# Patient Record
Sex: Female | Born: 1965
Health system: Southern US, Community
[De-identification: ages and names within clinical notes are randomized; demographics above are authoritative.]

## PROBLEM LIST (undated history)

## (undated) DIAGNOSIS — B20 Human immunodeficiency virus [HIV] disease: Secondary | ICD-10-CM

## (undated) DIAGNOSIS — Z21 Asymptomatic human immunodeficiency virus [HIV] infection status: Secondary | ICD-10-CM

## (undated) DIAGNOSIS — I10 Essential (primary) hypertension: Secondary | ICD-10-CM

## (undated) HISTORY — PX: ABDOMINAL HYSTERECTOMY: SHX81

## (undated) HISTORY — DX: Human immunodeficiency virus (HIV) disease: B20

## (undated) HISTORY — DX: Asymptomatic human immunodeficiency virus (hiv) infection status: Z21

## (undated) HISTORY — PX: BREAST BIOPSY: SHX20

---

## 2019-04-30 ENCOUNTER — Telehealth: Payer: Self-pay | Admitting: Hematology and Oncology

## 2019-04-30 NOTE — Telephone Encounter (Signed)
Scheduled per referral. Called and spoke with pt, confirmed 4/1 appt. Pt made aware to arrive 15-30 mins early and to bring insurance card with photo ID 

## 2019-05-05 ENCOUNTER — Encounter: Payer: Self-pay | Admitting: Infectious Diseases

## 2019-05-05 ENCOUNTER — Ambulatory Visit (INDEPENDENT_AMBULATORY_CARE_PROVIDER_SITE_OTHER): Payer: 59 | Admitting: Infectious Diseases

## 2019-05-05 ENCOUNTER — Other Ambulatory Visit: Payer: Self-pay

## 2019-05-05 ENCOUNTER — Telehealth: Payer: Self-pay | Admitting: Pharmacy Technician

## 2019-05-05 VITALS — BP 154/93 | HR 94 | Temp 98.4°F | Ht 69.0 in | Wt 135.0 lb

## 2019-05-05 DIAGNOSIS — R634 Abnormal weight loss: Secondary | ICD-10-CM | POA: Diagnosis not present

## 2019-05-05 DIAGNOSIS — R590 Localized enlarged lymph nodes: Secondary | ICD-10-CM | POA: Insufficient documentation

## 2019-05-05 DIAGNOSIS — B20 Human immunodeficiency virus [HIV] disease: Secondary | ICD-10-CM

## 2019-05-05 MED ORDER — BICTEGRAVIR-EMTRICITAB-TENOFOV 50-200-25 MG PO TABS: 1.0000 | ORAL_TABLET | Freq: Every day | ORAL | 5 refills | Status: DC

## 2019-05-05 NOTE — Assessment & Plan Note (Addendum)
Patient reports she was first diagnosed with HIV in 2010 and was started on Stribild and managed by her local health department. She was on the medication for about 3-4 years. The medication made her sick so she stopped taking it. She has been off ART for 3-4 years now.   Today she is agreeable to start [place] one tablet once a day. She was introduced to the pharmacist and pharmacy tech to help with medication management. She received a month sample of Biktarvy in the meantime. Will check labs today including: HIV viral load, CD4 count, CMP with GFR, CBC with diff/platelets, Hep B surface antigen, antibody, qualitative and core, Hep A antibody, Hep C antibody, Quantiferon-TB, and HLAB*5701.  Educated patient about the clinic free mental health counseling that is available to her.   Will check viral load and CD4 count again in 4 weeks and will follow up with patient in 6 weeks.

## 2019-05-05 NOTE — Progress Notes (Signed)
HPI: Courtney Kim is a 54 y.o. female who presents to the [place] clinic today to initiate care with NP Dixon for her HIV infection.  There are no problems to display for this patient.   Patient's Medications  New Prescriptions   BICTEGRAVIR-EMTRICITABINE-TENOFOVIR AF (BIKTARVY) 50-200-25 MG TABS TABLET    Take 1 tablet by mouth daily. Try to take at the same time each day with or without food.  Previous Medications   No medications on file  Modified Medications   No medications on file  Discontinued Medications   No medications on file    Allergies: Allergies  Allergen Reactions  . Hydrocodone-Acetaminophen Other (See Comments)    Past Medical History: Past Medical History:  Diagnosis Date  . HIV infection [facility])     Social History: Social History   Socioeconomic History  . Marital status: Single    Spouse name: Not on file  . Number of children: Not on file  . Years of education: Not on file  . Highest education level: Not on file  Occupational History  . Not on file  Tobacco Use  . Smoking status: Current Some Day Smoker    Types: Cigarettes  . Smokeless tobacco: Current User  Substance and Sexual Activity  . Alcohol use: Never  . Drug use: Never  . Sexual activity: Not Currently    Partners: Male  Other Topics Concern  . Not on file  Social History Narrative   Leave alone   Smoke cigarette occasion   Social Determinants of Health   Financial Resource Strain:   . Difficulty of Paying Living Expenses:   Food Insecurity:   . Worried About Charity fundraiser in the Last Year:   . Arboriculturist in the Last Year:   Transportation Needs:   . Film/video editor (Medical):   Marland Kitchen Lack of Transportation (Non-Medical):   Physical Activity:   . Days of Exercise per Week:   . Minutes of Exercise per Session:   Stress:   . Feeling of Stress :   Social Connections:   . Frequency of Communication with Friends and Family:   . Frequency of Social  Gatherings with Friends and Family:   . Attends Religious Services:   . Active Member of Clubs or Organizations:   . Attends Archivist Meetings:   Marland Kitchen Marital Status:     Labs: No results found for: HIV1RNAQUANT, HIV1RNAVL, CD4TABS  RPR and STI No results found for: LABRPR, RPRTITER  No flowsheet data found.  Hepatitis B No results found for: HEPBSAB, HEPBSAG, HEPBCAB Hepatitis C No results found for: HEPCAB, HCVRNAPCRQN Hepatitis A No results found for: HAV Lipids: No results found for: CHOL, TRIG, HDL, CHOLHDL, VLDL, LDLCALC  Current HIV Regimen: Old Regimen: Stribild (~4 years ago)  Assessment: DC is here today to initiate care with NP Dixon for her HIV infection. DC is treatment experienced with history of Stribild ~4 years ago and she has been without treatment since then. She stopped taking the medication d/t ADE. Will start patient on [place].  We spoke to DC about administration/ADE of Biktarvy stressing the importance of adherence.  The patient asked if the medication will help her gain weight. This was addressed and the patient had no other concerns.   Rx being sent to the [facility] outpatient pharmacy and patient will pick up today.  Plan: 1) Start Biktravy 2) F/U with NP Talmadge [employer] Student Pharmacist, Class of [place]  for Infectious Disease 05/05/2019, 12:19 PM

## 2019-05-05 NOTE — Telephone Encounter (Addendum)
[facility] Patient Advocate Encounter    Findings of the benefits investigation:   Insurance: [employer]- [employer]  Estimated copay amount: $tbd Prior Authorization: will need to submit, [name] is non-formulary  [facility] Patient Advocate Encounter   Was successful in obtaining a [employer] copay card for [employer]. This copay card will make the patients copay $0 once medication is approved.  The billing information is RxBin: [id number] PCN: ACCESS Member ID: [id number] Group ID: [id number]

## 2019-05-05 NOTE — Patient Instructions (Addendum)
It is wonderful to meet you - I am looking forward to working with you.   Biktarvy is the pill I would like for you to start taking to treat you - this will need to be taken once a day around the same time.  - Common side effects for a short time frame usually include headaches, nausea and diarrhea - OK to take over the counter tylenol for headaches and imodium for diarrhea - Try taking with food if you are nauseated  - If you take any multivitamins or supplements please separate them from your Biktarvy by 6 hours before and after.  The main thing is do not have them in the stomach at the same time. - We will be securing copay assistance for this medication so it will be on file with your pharmacy.   I think you will tolerate this pill much better. Eventually I am hopeful we can work with your insurance to   Please stop by the lab on your way out.   Please return in 1 month for repeat labs and a return visit 2 weeks later. We will review you labs together at these appointments.    For your Allergies -  1. Over the counter antihistamines once a day (Zyretec, Claritin, Allegra, or if worse at night can do Benadryl)  2. Nasal spray called Flonase 1-2 sprays a day  3. Saline spray will also help clean out your nose from the allergens  **all of these are safe to take with your biktarvy

## 2019-05-05 NOTE — Assessment & Plan Note (Signed)
Patient reported unintentional weight loss about 30lbs in the past 2 years. She has a good appetite and no trouble with eating. She denies NV/D. Will start on ART and monitor.

## 2019-05-05 NOTE — Assessment & Plan Note (Signed)
Patient report right cervical lymphadenopathy that she noticed around the time she was diagnosed with HIV in 2010. The size has not changed. She is being referred to Oncology by her PCP, appointment is set for April 1st 2021.

## 2019-05-05 NOTE — Progress Notes (Signed)
Subjective:    Patient ID: Courtney Kim is a 54 y.o. female     DOB: [birth year]   MRN: [id number]    Chief Complaint  Patient presents with  . New Patient (Initial Visit)    B20     HPI  This is a new patient to the clinic being referred by her PCP. She is from [place]. Patient was diagnosed with HIV in 2010 she was started on Stribild and was being managed by her local health department. She reported that the Stribild made her feel sick and impacted her tooth. She was on it for about 3-4 years and stopped therapy on her own. She has been off ART for about 4 years now. She has lost 30lbs unintentionally within two years. She has a good appetite. She denies N/V/D. She has some days that she feels tired but overall feels well.  She reports a chronic dry cough for about 7-8 months. Noticed more around time she smokes. The cough occurs throughout the day.  Patient is reporting right neck lymphadenopathy that she had for the past 10 years that has not changed. Her PCP has referred her to Oncology for further evaluation.   Today in clinic patient is emotional and tearful while discussing her HIV status.Gave patient the opportunity to express herself and provided emotional support. Educated patient about the clinic resources including free one on one mental health counseling.   Review of Systems  Constitutional: Positive for weight loss. Negative for chills and fever.  HENT: Positive for congestion.   Eyes: Negative for blurred vision and redness.  Respiratory: Positive for cough. Negative for sputum production, shortness of breath and wheezing.   Cardiovascular: Negative for chest pain, palpitations and leg swelling.  Gastrointestinal: Negative for abdominal pain, constipation, diarrhea, nausea and vomiting.  Musculoskeletal: Negative for back pain and neck pain.  Skin: Negative for rash.  Neurological: Negative for dizziness, weakness and headaches.  Psychiatric/Behavioral: Negative  for substance abuse.      No outpatient medications prior to visit.   No facility-administered medications prior to visit.    Past Medical History:  Diagnosis Date  . HIV infection ([place])     Family History  Problem Relation Age of Onset  . Hypertension Mother     Social History   Socioeconomic History  . Marital status: Single    Spouse name: Not on file  . Number of children: Not on file  . Years of education: Not on file  . Highest education level: Not on file  Occupational History  . Not on file  Tobacco Use  . Smoking status: Current Some Day Smoker    Types: Cigarettes  . Smokeless tobacco: Current User  Substance and Sexual Activity  . Alcohol use: Never  . Drug use: Never  . Sexual activity: Not Currently    Partners: Male  Other Topics Concern  . Not on file  Social History Narrative   Leave alone   Smoke cigarette occasion   Social Determinants of Health   Financial Resource Strain:   . Difficulty of Paying Living Expenses:   Food Insecurity:   . Worried About Charity fundraiser in the Last Year:   . Arboriculturist in the Last Year:   Transportation Needs:   . Film/video editor (Medical):   Marland Kitchen Lack of Transportation (Non-Medical):   Physical Activity:   . Days of Exercise per Week:   . Minutes of Exercise per Session:  Stress:   . Feeling of Stress :   Social Connections:   . Frequency of Communication with Friends and Family:   . Frequency of Social Gatherings with Friends and Family:   . Attends Religious Services:   . Active Member of Clubs or Organizations:   . Attends Archivist Meetings:   Marland Kitchen Marital Status:   Intimate Partner Violence:   . Fear of Current or Ex-Partner:   . Emotionally Abused:   Marland Kitchen Physically Abused:   . Sexually Abused:          Objective:    Today's Vitals   05/05/19 1133  BP: (!) 154/93  Pulse: 94  Temp: 98.4 F (36.9 C)  SpO2: 100%  Weight: 135 lb (61.2 kg)  Height: '5\' 9"'$  (1.753  m)   Body mass index is 19.94 kg/m.  Physical Exam Constitutional:      Appearance: Normal appearance.  HENT:     Head: Normocephalic.     Nose: Nose normal.     Mouth/Throat:     Mouth: Mucous membranes are moist.  Eyes:     Extraocular Movements: Extraocular movements intact.     Pupils: Pupils are equal, round, and reactive to light.  Cardiovascular:     Rate and Rhythm: Normal rate and regular rhythm.     Heart sounds: Normal heart sounds.  Pulmonary:     Effort: Pulmonary effort is normal.     Breath sounds: Normal breath sounds.  Abdominal:     General: Abdomen is flat. Bowel sounds are normal.  Musculoskeletal:        General: Normal range of motion.     Cervical back: Normal range of motion.  Lymphadenopathy:     Cervical: Cervical adenopathy present.  Skin:    General: Skin is warm and dry.  Neurological:     Mental Status: She is alert and oriented to person, place, and time.  Psychiatric:        Behavior: Behavior normal.        Thought Content: Thought content normal.     Comments: Tearful      LABS: No results found for: HIV1RNAQUANT  No results found for: CREATININE  No results found for: ALT, AST, GGT, ALKPHOS, BILITOT  No results found for: WBC, HGB, HCT, MCV, PLT  No results found for: RPR      Assessment & Plan:   Problem List Items Addressed This Visit      Immune and Lymphatic   Lymphadenopathy of right cervical region    Patient report right cervical lymphadenopathy that she noticed around the time she was diagnosed with HIV in 2010. The size has not changed. She is being referred to Oncology by her PCP, appointment is set for April 1st 2021.          Other   Symptomatic HIV infection ([place]) - Primary    Patient reports she was first diagnosed with HIV in 2010 and was started on Stribild and managed by her local health department. She was on the medication for about 3-4 years. The medication made her sick so she stopped taking it.  She has been off ART for 3-4 years now.   Today she is agreeable to start [place] one tablet once a day. She was introduced to the pharmacist and pharmacy tech to help with medication management. She received a month sample of Biktarvy in the meantime. Will check labs today including: HIV viral load, CD4 count, CMP with GFR, CBC  with diff/platelets, Hep B surface antigen, antibody, qualitative and core, Hep A antibody, Hep C antibody, Quantiferon-TB, and HLAB*5701.  Educated patient about the clinic free mental health counseling that is available to her.   Will check viral load and CD4 count again in 4 weeks and will follow up with patient in 6 weeks.        Relevant Medications   bictegravir-emtricitabine-tenofovir AF (BIKTARVY) 50-200-25 MG TABS tablet   Other Relevant Orders   HIV RNA, RTPCR W/R GT (RTI, PI,INT)   T-helper cell (CD4)- ([facility] clinic only)   COMPLETE METABOLIC PANEL WITH GFR   CBC with Differential/Platelet   Hepatitis B surface antigen   Hepatitis B surface antibody,qualitative   Hepatitis A antibody, total   Hepatitis C antibody   Hepatitis B Core Antibody, total   QuantiFERON-TB Gold Plus   HLA B*5701   HIV-1 RNA quant-no reflex-bld   T-helper cell (CD4)- ([facility] clinic only)   Unintentional weight loss    Patient reported unintentional weight loss about 30lbs in the past 2 years. She has a good appetite and no trouble with eating. She denies NV/D. Will start on ART and monitor.            [facility]

## 2019-05-06 ENCOUNTER — Telehealth: Payer: Self-pay | Admitting: Pharmacy Technician

## 2019-05-06 ENCOUNTER — Telehealth: Payer: Self-pay | Admitting: Infectious Diseases

## 2019-05-06 LAB — T-HELPER CELL (CD4) - (RCID CLINIC ONLY)
CD4 % Helper T Cell: 9 % — ABNORMAL LOW (ref 33–65)
CD4 T Cell Abs: 53 /uL — ABNORMAL LOW (ref 400–1790)

## 2019-05-06 MED ORDER — SULFAMETHOXAZOLE-TRIMETHOPRIM 400-80 MG PO TABS: 1.0000 | ORAL_TABLET | Freq: Every day | ORAL | 5 refills | Status: DC

## 2019-05-06 MED FILL — BIKTARVY 50-200-25 MG TABS: 50-200-25 | 30 days supply | Qty: 30 | Fill #0

## 2019-05-06 MED FILL — SULFAMETHOXAZOLE-TMP SS TAB: 400-80 | 30 days supply | Qty: 30 | Fill #0

## 2019-05-06 NOTE — Telephone Encounter (Signed)
[facility] Patient Advocate Encounter   Received notification from [employer] that prior authorization for [name]  is required.   PA submitted on 05/06/2019 Key [id number] Status is pending [phone]    [facility] will continue to follow.  Beulah Gandy, CPhT Specialty Pharmacy Patient [facility] for Infectious Disease Phone: [phone] Fax: [phone] 05/06/2019 12:07 PM

## 2019-05-06 NOTE — Telephone Encounter (Signed)
Patient notified of CD4 < 200. Will add 1 SS Bactrim QD. Welcomed and answered all questions.   She will pick up at [facility]

## 2019-05-06 NOTE — Telephone Encounter (Signed)
[facility] Patient Advocate Encounter  Prior Authorization for [name]  has been approved.    PA# [id number] Case: [id number] Effective dates: 05/06/2019 through 05/05/2020  Patients co-pay is $0.   [facility] will continue to follow. She has 28 days worth of medication. Will connect to see when she would like to pick up her refill.   Beulah Gandy, CPhT Specialty Pharmacy Patient [facility] for Infectious Disease Phone: [phone] Fax: [phone] 05/06/2019 2:38 PM

## 2019-05-16 LAB — HIV-1 INTEGRASE GENOTYPE

## 2019-05-16 LAB — COMPLETE METABOLIC PANEL WITH GFR
AG Ratio: 0.7 (calc) — ABNORMAL LOW (ref 1.0–2.5)
ALT: 21 U/L (ref 6–29)
AST: 32 U/L (ref 10–35)
Albumin: 3.9 g/dL (ref 3.6–5.1)
Alkaline phosphatase (APISO): 54 U/L (ref 37–153)
BUN: 12 mg/dL (ref 7–25)
CO2: 29 mmol/L (ref 20–32)
Calcium: 9.2 mg/dL (ref 8.6–10.4)
Chloride: 105 mmol/L (ref 98–110)
Creat: 0.56 mg/dL (ref 0.50–1.05)
GFR, Est African American: 123 mL/min/{1.73_m2} (ref >=60)
GFR, Est Non African American: 106 mL/min/{1.73_m2} (ref >=60)
Globulin: 6 g/dL (calc) — ABNORMAL HIGH (ref 1.9–3.7)
Glucose, Bld: 69 mg/dL (ref 65–99)
Potassium: 3.8 mmol/L (ref 3.5–5.3)
Sodium: 136 mmol/L (ref 135–146)
Total Bilirubin: 0.5 mg/dL (ref 0.2–1.2)
Total Protein: 9.9 g/dL — ABNORMAL HIGH (ref 6.1–8.1)

## 2019-05-16 LAB — CBC WITH DIFFERENTIAL/PLATELET
Absolute Monocytes: 290 cells/uL (ref 200–950)
Basophils Absolute: 9 cells/uL (ref 0–200)
Basophils Relative: 0.4 %
Eosinophils Absolute: 21 cells/uL (ref 15–500)
Eosinophils Relative: 0.9 %
HCT: 29.2 % — ABNORMAL LOW (ref 35.0–45.0)
Hemoglobin: 9.4 g/dL — ABNORMAL LOW (ref 11.7–15.5)
Lymphs Abs: 787 cells/uL — ABNORMAL LOW (ref 850–3900)
MCH: 27.3 pg (ref 27.0–33.0)
MCHC: 32.2 g/dL (ref 32.0–36.0)
MCV: 84.9 fL (ref 80.0–100.0)
MPV: 11.9 fL (ref 7.5–12.5)
Monocytes Relative: 12.6 %
Neutro Abs: 1194 cells/uL — ABNORMAL LOW (ref 1500–7800)
Neutrophils Relative %: 51.9 %
Platelets: 147 10*3/uL (ref 140–400)
RBC: 3.44 10*6/uL — ABNORMAL LOW (ref 3.80–5.10)
RDW: 14.1 % (ref 11.0–15.0)
Total Lymphocyte: 34.2 %
WBC: 2.3 10*3/uL — ABNORMAL LOW (ref 3.8–10.8)

## 2019-05-16 LAB — QUANTIFERON-TB GOLD PLUS
Mitogen-NIL: 2.64 IU/mL
NIL: 0.07 IU/mL
QuantiFERON-TB Gold Plus: NEGATIVE
TB1-NIL: 0 IU/mL
TB2-NIL: 0.04 IU/mL

## 2019-05-16 LAB — HLA B*5701: HLA-B*5701 w/rflx HLA-B High: NEGATIVE

## 2019-05-16 LAB — HIV RNA, RTPCR W/R GT (RTI, PI,INT)
HIV 1 RNA Quant: 339000 copies/mL — ABNORMAL HIGH
HIV-1 RNA Quant, Log: 5.53 Log copies/mL — ABNORMAL HIGH

## 2019-05-16 LAB — HIV-1 GENOTYPE: HIV-1 Genotype: DETECTED — AB

## 2019-05-16 LAB — HEPATITIS A ANTIBODY, TOTAL: Hepatitis A AB,Total: NONREACTIVE

## 2019-05-16 LAB — HEPATITIS B CORE ANTIBODY, TOTAL: Hep B Core Total Ab: NONREACTIVE

## 2019-05-16 LAB — HEPATITIS C ANTIBODY
Hepatitis C Ab: NONREACTIVE
SIGNAL TO CUT-OFF: 0.26 (ref <1.00)

## 2019-05-16 LAB — HEPATITIS B SURFACE ANTIGEN: Hepatitis B Surface Ag: NONREACTIVE

## 2019-05-16 LAB — HEPATITIS B SURFACE ANTIBODY,QUALITATIVE: Hep B S Ab: BORDERLINE — AB

## 2019-05-18 ENCOUNTER — Other Ambulatory Visit: Payer: 59

## 2019-05-18 ENCOUNTER — Other Ambulatory Visit: Payer: Self-pay

## 2019-05-18 DIAGNOSIS — B20 Human immunodeficiency virus [HIV] disease: Secondary | ICD-10-CM

## 2019-05-19 LAB — T-HELPER CELL (CD4) - (RCID CLINIC ONLY)
CD4 % Helper T Cell: 10 % — ABNORMAL LOW (ref 33–65)
CD4 T Cell Abs: 152 /uL — ABNORMAL LOW (ref 400–1790)

## 2019-05-20 NOTE — Progress Notes (Signed)
[place] [phone]) [phone]   [phone]  INITIAL CONSULT NOTE  Patient Care Team: Patient, No Pcp Per as PCP - General (General Practice)  Hematological/Oncological History # Hyperproteinemia 1) 05/05/2019: Protein 9.9 on routine CMP.  2) 05/21/2019: establish care with Dr. Lorenso Courier   #Right Cervical Lymphadenopathy  1) patient notes it has been present since time of HIV diagnosis in 2010 2) 05/21/2019: establish care with Dr. Lorenso Courier   #Normocytic Anemia 1) 10/03/2016: WBC 3.1, Hgb 10.5, Plt 224, MCV 85 2) 05/05/2019: WBC 2.3, Hgb 9.4, MCV 84.9, Plt 147. CD4 count 53 3) 05/21/2019: establish care with Dr. Lorenso Courier   CHIEF COMPLAINTS/PURPOSE OF CONSULTATION:  "Hyperproteinemia "  HISTORY OF PRESENTING ILLNESS:  Courtney Kim 54 y.o. female with medical history significant for HIV infection who presents for evaluation of hyperproteinemia.   On review of the previous records the patient recently established with Dr. Janene Madeira with the [facility].  She was initially seen on 05/05/2019 at which time it was noted that she had stopped her HIV medication for years prior during assessment on 05/05/2018 when she underwent routine blood work which showed a white blood cell count of 2.3, hemoglobin 9.4, platelet count 147, CD4 count of 53, and on CMP a protein level of 9.9.  She was started up on antiretroviral therapy with Biktarvy and referred to hematology for evaluation of a longstanding right cervical lymph node as well as hyperproteinemia.  On exam today Courtney Kim tearful discussing her diagnosis of HIV.  She reports that she has been well overall, but she has lost approximately 15 pounds in the last 2 years.  She does endorse having the lymphadenopathy on her neck since at least 2010.  She notes that the lymphadenopathy was what led her to seek medical attention that led to her HIV diagnosis.  She  reports that since the time it first developed the lymph node has not increased in size and is nontender.  She notes that she has not noticed any other lymph nodes elsewhere on her body including the other side of her neck, underarms, or in her groin area.  She denies having any fevers, chills, sweats, nausea, vomiting or diarrhea.  On further review she notes that this lymph node has never undergone a biopsy and has never had any form of imaging performed on it.  She is currently an active smoker smoking approximately 2 cigarettes/month, but notes she was much heavier smoker in her 41s when she was smoking 1 pack/day.  She does have a father with a history of sickle cell anemia, and notes that she is a carrier of the trait.  She reports that her mother is healthy, but that her paternal grand mother died of breast cancer.  Other than the lymphadenopathy in the neck the patient had no other symptoms.  She denies having any shortness of breath, chest pain, dark stools, bleeding, bruising, or recent changes in energy level.  A full 10 point ROS is listed below.  MEDICAL HISTORY:  Past Medical History:  Diagnosis Date   HIV infection ([place])     SURGICAL HISTORY: Past Surgical History:  Procedure Laterality Date   ABDOMINAL HYSTERECTOMY      SOCIAL HISTORY: Social History   Socioeconomic History   Marital status: Single    Spouse name: Not on file   Number of children: Not on file   Years of education: Not on file  Highest education level: Not on file  Occupational History   Not on file  Tobacco Use   Smoking status: Current Some Day Smoker    Types: Cigarettes   Smokeless tobacco: Current User  Substance and Sexual Activity   Alcohol use: Never   Drug use: Never   Sexual activity: Not Currently    Partners: Male  Other Topics Concern   Not on file  Social History Narrative   Leave alone   Smoke cigarette occasion   Social Determinants of Health   Financial  Resource Strain:    Difficulty of Paying Living Expenses:   Food Insecurity:    Worried About Charity fundraiser in the Last Year:    Arboriculturist in the Last Year:   Transportation Needs:    Film/video editor (Medical):    Lack of Transportation (Non-Medical):   Physical Activity:    Days of Exercise per Week:    Minutes of Exercise per Session:   Stress:    Feeling of Stress :   Social Connections:    Frequency of Communication with Friends and Family:    Frequency of Social Gatherings with Friends and Family:    Attends Religious Services:    Active Member of Clubs or Organizations:    Attends Music therapist:    Marital Status:   Intimate Partner Violence:    Fear of Current or Ex-Partner:    Emotionally Abused:    Physically Abused:    Sexually Abused:     FAMILY HISTORY: Family History  Problem Relation Age of Onset   Hypertension Mother    Sickle cell anemia Father    Breast cancer Paternal Grandmother     ALLERGIES:  is allergic to hydrocodone-acetaminophen.  MEDICATIONS:  Current Outpatient Medications  Medication Sig Dispense Refill   ibuprofen (ADVIL) 200 MG tablet Take 200 mg by mouth every 6 (six) hours as needed.     bictegravir-emtricitabine-tenofovir AF (BIKTARVY) 50-200-25 MG TABS tablet Take 1 tablet by mouth daily. Try to take at the same time each day with or without food. 30 tablet 5   sulfamethoxazole-trimethoprim (BACTRIM) 400-80 MG tablet Take 1 tablet by mouth daily. 30 tablet 5   No current facility-administered medications for this visit.    REVIEW OF SYSTEMS:   Constitutional: ( - ) fevers, ( - )  chills , ( - ) night sweats Eyes: ( - ) blurriness of vision, ( - ) double vision, ( - ) watery eyes Ears, nose, mouth, throat, and face: ( - ) mucositis, ( - ) sore throat Respiratory: ( - ) cough, ( - ) dyspnea, ( - ) wheezes Cardiovascular: ( - ) palpitation, ( - ) chest discomfort, ( - ) lower  extremity swelling Gastrointestinal:  ( - ) nausea, ( - ) heartburn, ( - ) change in bowel habits Skin: ( - ) abnormal skin rashes Lymphatics: ( - ) new lymphadenopathy, ( - ) easy bruising Neurological: ( - ) numbness, ( - ) tingling, ( - ) new weaknesses Behavioral/Psych: ( - ) mood change, ( - ) new changes  All other systems were reviewed with the patient and are negative.  PHYSICAL EXAMINATION: ECOG PERFORMANCE STATUS: 0 - Asymptomatic  Vitals:   05/21/19 1406  BP: (!) 150/96  Pulse: (!) 103  Resp: 20  Temp: 98.7 F (37.1 C)  SpO2: 100%   Filed Weights   05/21/19 1406  Weight: 133 lb 14.4 oz (60.7 kg)  GENERAL: well appearing thin middle aged [place] American female in NAD  SKIN: skin color, texture, turgor are normal, no rashes or significant lesions EYES: conjunctiva are pink and non-injected, sclera clear LYMPH:  Palpable nodule in right posterior cervical chain, consistent with lymphadenopathy. No axillary, supraclavicular, or left cervical lymphadenopathy appreciated.  LUNGS: clear to auscultation and percussion with normal breathing effort HEART: regular rate & rhythm and no murmurs and no lower extremity edema Musculoskeletal: no cyanosis of digits and no clubbing  PSYCH: alert & oriented x 3, fluent speech NEURO: no focal motor/sensory deficits  LABORATORY DATA:  I have reviewed the data as listed CBC Latest Ref Rng & Units 05/21/2019 05/05/2019  WBC 4.0 - 10.5 K/uL 3.2(L) 2.3(L)  Hemoglobin 12.0 - 15.0 g/dL 9.7(L) 9.4(L)  Hematocrit 36.0 - 46.0 % 29.7(L) 29.2(L)  Platelets 150 - 400 K/uL 224 147    CMP Latest Ref Rng & Units 05/21/2019 05/05/2019  Glucose 70 - 99 mg/dL 85 69  BUN 6 - 20 mg/dL 21(H) 12  Creatinine 0.44 - 1.00 mg/dL 0.84 0.56  Sodium 135 - 145 mmol/L 134(L) 136  Potassium 3.5 - 5.1 mmol/L 4.1 3.8  Chloride 98 - 111 mmol/L 103 105  CO2 22 - 32 mmol/L 25 29  Calcium 8.9 - 10.3 mg/dL 9.5 9.2  Total Protein 6.5 - 8.1 g/dL 11.1(H) 9.9(H)    Total Bilirubin 0.3 - 1.2 mg/dL 0.5 0.5  Alkaline Phos 38 - 126 U/L 64 -  AST 15 - 41 U/L 22 32  ALT 0 - 44 U/L 12 21     PATHOLOGY: None relevant to review.   RADIOGRAPHIC STUDIES: I have personally reviewed the radiological images as listed and agreed with the findings in the report. No results found.  ASSESSMENT & PLAN Courtney Kim 54 y.o. female with medical history significant for HIV infection who presents for evaluation of hyperproteinemia.  After review the labs and discussion with the patient her findings are consistent with isolated lymphadenopathy, and a normocytic anemia.  As for the patient's hyperproteinemia there are numerous possible etiologies including uncontrolled viral infections as well as monoclonal gammopathy's.  Today we will do a full evaluation to rule out monoclonal gammopathy's.  We will order SPEP, UPEP and serum free light chains.  Additionally we have collected a UA to determine if there is proteinuria.  In the event that there is no monoclonal component I would likely attribute this high protein level to the patient's poorly controlled HIV.  In regards to the solitary isolated right cervical lymph node it appears to have been stable over the last 10 years.  This is markedly reassuring this is a benign finding.  Although the physical exam is consistent with lymphadenopathy I would recommend we perform an ultrasound to assure that this is not an oddly located lipoma.  If this is found indeed to be a lymph node I do believe that we could perform a core biopsy to assure that this is benign in nature.  Overall I think the clinical picture the patient presents is reassuring.  There is no other lymphadenopathy associated with it and the patient is not having any B symptoms or any other findings that would be concerning for a lymphoma.  Regarding the patient's anemia it appears to be chronic in nature dating back to at least 2018 with a hemoglobin at that time of  10.5, now in the low nines.  We will do basic nutritional evaluation today, however once again uncontrolled HIV can  be associated with a decrease in hemoglobin levels.  In the event that improvement in her CD4 count does not raise the hemoglobin we can consider further evaluation with deeper nutritional studies and consideration of a bone marrow biopsy.  #Hyperproteinemia --today will order repeat CBC and CMP for baseline --will order SPEP, UPEP, and SFLC to assure no evidence of monoclonal gammopathy --additional supporting labs to include LDH, beta 2 microglobulin and UA --if evidence of monoclonal gammopathy will order DG bone survey and consider bone marrow biopsy --will order ESR and CRP to r/o inflammatory causes of hyperproteinemia.  --ddx also includes uncontrolled HIV, Hep B, or Hep C. (Hepatitis panel negative on 05/05/2019)  --RTC pending the results of the above labs.   #Isolated Lymphadenopathy --enlarged right sided cervical lymph node, reportedly stable x 10 years --non-tender, not enlarging --recommend a US of the neck to further assess and assure it is a lymph node --can consider core biopsy of the lymph node, however given its relative stability over the last 10 years it is highly likely to be benign in nature --continue to monitor  #Normocytic Anemia #Leukopenia --chronicity of this anemia is not clear. Last Hgb on 05/05/2019 was 9.4 with a leukopenia of 2.3 ([place] 1194). Prior Hgb in 2018 was also low at 10.5 --will assess peripheral blood film and nutritional studies with iron panel, ferritin --monoclonal gammopathy and lymphadenopathy workup as above --poorly controlled HIV can also contribute to anemia, particularly in African American females (J Acquir Immune Defic Syndr. 2001 Jan 1;26(1):28-35).  --if improvement in HIV burden does not improve anemia, can consider further nutritional/bone marrow evaluation.  --continue to monitor.   Orders Placed This Encounter   Procedures   US Soft Tissue Head/Neck    Standing Status:   Future    Standing Expiration Date:   05/20/2020    Order Specific Question:   Reason for Exam (SYMPTOM  OR DIAGNOSIS REQUIRED)    Answer:   Right cervical nodule, lipoma vs lymph node    Order Specific Question:   Preferred imaging location?    Answer:   [facility]   CBC with Differential (Cancer Center Only)    Standing Status:   Future    Number of Occurrences:   1    Standing Expiration Date:   05/20/2020   CMP ([facility] only)    Standing Status:   Future    Number of Occurrences:   1    Standing Expiration Date:   05/20/2020   Lactate dehydrogenase (LDH)    Standing Status:   Future    Number of Occurrences:   1    Standing Expiration Date:   05/20/2020   Urinalysis, Complete w Microscopic    Standing Status:   Future    Number of Occurrences:   1    Standing Expiration Date:   05/20/2020   Sedimentation rate    Standing Status:   Future    Number of Occurrences:   1    Standing Expiration Date:   05/20/2020   C-reactive protein    Standing Status:   Future    Number of Occurrences:   1    Standing Expiration Date:   05/20/2020   Multiple Myeloma Panel (SPEP&IFE w/QIG)    Standing Status:   Future    Number of Occurrences:   1    Standing Expiration Date:   05/20/2020   Kappa/lambda light chains    Standing Status:   Future  Number of Occurrences:   1    Standing Expiration Date:   05/20/2020   24-Hr Ur UPEP/UIFE/Light Chains/TP    Standing Status:   Future    Standing Expiration Date:   05/20/2020   Iron and TIBC    Standing Status:   Future    Number of Occurrences:   1    Standing Expiration Date:   05/20/2020   Ferritin    Standing Status:   Future    Number of Occurrences:   1    Standing Expiration Date:   05/20/2020   Save Smear (SSMR)    Standing Status:   Future    Number of Occurrences:   1    Standing Expiration Date:   05/20/2020    All questions were answered. The patient knows  to call the clinic with any problems, questions or concerns.  A total of more than 60 minutes were spent on this encounter and over half of that time was spent on counseling and coordination of care as outlined above.   Ledell Peoples, MD Department of Hematology/Oncology [place] [facility] ([facility] Phone: [phone] Pager: [phone] Email: [email or web address]  05/21/2019 6:14 PM   Literature Support:  Clovis Riley AM, Eunice Blase, Masri-Lavine Chana Bode, Jennelle Human, [place], Watts H. Prevalence and correlates of anemia in a large cohort of HIV-infected women: Women's Interagency HIV Study. J Acquir Immune Defic Syndr. 2001 Jan 1;26(1):28-35.  --Among HIV-infected women, multivariate logistic analyses revealed that African American race (p < .0001), MCV < 80 fl (p < .0001), CD4 count < 200 per microliter (p <.0001), higher HIV RNA in plasma (p = .02), current use of ZDV (p = .01), and history of clinical AIDS (p = .004) were all independent predictors of anemia. These data indicate that worsening parameters of HIV disease are associated with anemia among HIV-infected women.

## 2019-05-21 ENCOUNTER — Other Ambulatory Visit: Payer: Self-pay

## 2019-05-21 ENCOUNTER — Inpatient Hospital Stay: Payer: 59

## 2019-05-21 ENCOUNTER — Encounter: Payer: Self-pay | Admitting: Hematology and Oncology

## 2019-05-21 ENCOUNTER — Inpatient Hospital Stay: Payer: 59 | Attending: Hematology and Oncology | Admitting: Hematology and Oncology

## 2019-05-21 VITALS — BP 150/96 | HR 103 | Temp 98.7°F | Resp 20 | Ht 69.0 in | Wt 133.9 lb

## 2019-05-21 DIAGNOSIS — E8809 Other disorders of plasma-protein metabolism, not elsewhere classified: Secondary | ICD-10-CM | POA: Diagnosis not present

## 2019-05-21 DIAGNOSIS — R590 Localized enlarged lymph nodes: Secondary | ICD-10-CM

## 2019-05-21 DIAGNOSIS — D649 Anemia, unspecified: Secondary | ICD-10-CM

## 2019-05-21 DIAGNOSIS — Z21 Asymptomatic human immunodeficiency virus [HIV] infection status: Secondary | ICD-10-CM | POA: Diagnosis present

## 2019-05-21 DIAGNOSIS — D72819 Decreased white blood cell count, unspecified: Secondary | ICD-10-CM | POA: Insufficient documentation

## 2019-05-21 LAB — CMP (CANCER CENTER ONLY)
ALT: 12 U/L (ref 0–44)
AST: 22 U/L (ref 15–41)
Albumin: 4 g/dL (ref 3.5–5.0)
Alkaline Phosphatase: 64 U/L (ref 38–126)
Anion gap: 6 (ref 5–15)
BUN: 21 mg/dL — ABNORMAL HIGH (ref 6–20)
CO2: 25 mmol/L (ref 22–32)
Calcium: 9.5 mg/dL (ref 8.9–10.3)
Chloride: 103 mmol/L (ref 98–111)
Creatinine: 0.84 mg/dL (ref 0.44–1.00)
GFR, Est AFR Am: >60 mL/min (ref >60)
GFR, Estimated: >60 mL/min (ref >60)
Glucose, Bld: 85 mg/dL (ref 70–99)
Potassium: 4.1 mmol/L (ref 3.5–5.1)
Sodium: 134 mmol/L — ABNORMAL LOW (ref 135–145)
Total Bilirubin: 0.5 mg/dL (ref 0.3–1.2)
Total Protein: 11.1 g/dL — ABNORMAL HIGH (ref 6.5–8.1)

## 2019-05-21 LAB — CBC WITH DIFFERENTIAL (CANCER CENTER ONLY)
Abs Immature Granulocytes: 0.01 10*3/uL (ref 0.00–0.07)
Basophils Absolute: 0 10*3/uL (ref 0.0–0.1)
Basophils Relative: 1 %
Eosinophils Absolute: 0.1 10*3/uL (ref 0.0–0.5)
Eosinophils Relative: 3 %
HCT: 29.7 % — ABNORMAL LOW (ref 36.0–46.0)
Hemoglobin: 9.7 g/dL — ABNORMAL LOW (ref 12.0–15.0)
Immature Granulocytes: 0 %
Lymphocytes Relative: 42 %
Lymphs Abs: 1.3 10*3/uL (ref 0.7–4.0)
MCH: 28.1 pg (ref 26.0–34.0)
MCHC: 32.7 g/dL (ref 30.0–36.0)
MCV: 86.1 fL (ref 80.0–100.0)
Monocytes Absolute: 0.3 10*3/uL (ref 0.1–1.0)
Monocytes Relative: 11 %
Neutro Abs: 1.4 10*3/uL — ABNORMAL LOW (ref 1.7–7.7)
Neutrophils Relative %: 43 %
Platelet Count: 224 10*3/uL (ref 150–400)
RBC: 3.45 MIL/uL — ABNORMAL LOW (ref 3.87–5.11)
RDW: 14.4 % (ref 11.5–15.5)
WBC Count: 3.2 10*3/uL — ABNORMAL LOW (ref 4.0–10.5)
nRBC: 0 % (ref 0.0–0.2)

## 2019-05-21 LAB — URINALYSIS, COMPLETE (UACMP) WITH MICROSCOPIC
Bilirubin Urine: NEGATIVE
Glucose, UA: NEGATIVE mg/dL
Hgb urine dipstick: NEGATIVE
Ketones, ur: NEGATIVE mg/dL
Leukocytes,Ua: NEGATIVE
Nitrite: NEGATIVE
Protein, ur: NEGATIVE mg/dL
Specific Gravity, Urine: 1.008 (ref 1.005–1.030)
pH: 6 (ref 5.0–8.0)

## 2019-05-21 LAB — HIV-1 RNA QUANT-NO REFLEX-BLD
HIV 1 RNA Quant: 1020 copies/mL — ABNORMAL HIGH
HIV-1 RNA Quant, Log: 3.01 Log copies/mL — ABNORMAL HIGH

## 2019-05-21 LAB — SEDIMENTATION RATE: Sed Rate: 105 mm/hr — ABNORMAL HIGH (ref 0–22)

## 2019-05-21 LAB — C-REACTIVE PROTEIN: CRP: 0.7 mg/dL (ref <1.0)

## 2019-05-21 LAB — SAVE SMEAR(SSMR), FOR PROVIDER SLIDE REVIEW

## 2019-05-21 LAB — LACTATE DEHYDROGENASE: LDH: 133 U/L (ref 98–192)

## 2019-05-22 ENCOUNTER — Telehealth: Payer: Self-pay | Admitting: Hematology and Oncology

## 2019-05-22 LAB — IRON AND TIBC
Iron: 38 ug/dL — ABNORMAL LOW (ref 41–142)
Saturation Ratios: 14 % — ABNORMAL LOW (ref 21–57)
TIBC: 274 ug/dL (ref 236–444)
UIBC: 235 ug/dL (ref 120–384)

## 2019-05-22 LAB — FERRITIN: Ferritin: 218 ng/mL (ref 11–307)

## 2019-05-22 LAB — KAPPA/LAMBDA LIGHT CHAINS
Kappa free light chain: 109.2 mg/L — ABNORMAL HIGH (ref 3.3–19.4)
Kappa, lambda light chain ratio: 2.33 — ABNORMAL HIGH (ref 0.26–1.65)
Lambda free light chains: 46.8 mg/L — ABNORMAL HIGH (ref 5.7–26.3)

## 2019-05-22 NOTE — Telephone Encounter (Signed)
Scheduled per los. Called and spoke with patient. Confirmed appt 

## 2019-05-25 ENCOUNTER — Encounter: Payer: Self-pay | Admitting: Infectious Diseases

## 2019-05-25 LAB — MULTIPLE MYELOMA PANEL, SERUM
Albumin SerPl Elph-Mcnc: 4 g/dL (ref 2.9–4.4)
Albumin/Glob SerPl: 0.7 (ref 0.7–1.7)
Alpha 1: 0.2 g/dL (ref 0.0–0.4)
Alpha2 Glob SerPl Elph-Mcnc: 0.7 g/dL (ref 0.4–1.0)
B-Globulin SerPl Elph-Mcnc: 1.1 g/dL (ref 0.7–1.3)
Gamma Glob SerPl Elph-Mcnc: 4.4 g/dL — ABNORMAL HIGH (ref 0.4–1.8)
Globulin, Total: 6.3 g/dL — ABNORMAL HIGH (ref 2.2–3.9)
IgA: 282 mg/dL (ref 87–352)
IgG (Immunoglobin G), Serum: 4286 mg/dL — ABNORMAL HIGH (ref 586–1602)
IgM (Immunoglobulin M), Srm: 170 mg/dL (ref 26–217)
Total Protein ELP: 10.3 g/dL — ABNORMAL HIGH (ref 6.0–8.5)

## 2019-05-27 ENCOUNTER — Other Ambulatory Visit: Payer: Self-pay

## 2019-05-27 ENCOUNTER — Ambulatory Visit (HOSPITAL_COMMUNITY)
Admission: RE | Admit: 2019-05-27 | Discharge: 2019-05-27 | Disposition: A | Discharge status: Home / Self Care | Payer: 59 | Source: Ambulatory Visit | Attending: Hematology and Oncology | Admitting: Hematology and Oncology

## 2019-05-27 DIAGNOSIS — R590 Localized enlarged lymph nodes: Secondary | ICD-10-CM | POA: Diagnosis present

## 2019-06-01 ENCOUNTER — Other Ambulatory Visit: Payer: Self-pay

## 2019-06-01 ENCOUNTER — Encounter: Payer: Self-pay | Admitting: Infectious Diseases

## 2019-06-01 ENCOUNTER — Ambulatory Visit (INDEPENDENT_AMBULATORY_CARE_PROVIDER_SITE_OTHER): Payer: 59 | Admitting: Infectious Diseases

## 2019-06-01 VITALS — BP 149/92 | HR 94 | Temp 98.1°F | Wt 135.0 lb

## 2019-06-01 DIAGNOSIS — R590 Localized enlarged lymph nodes: Secondary | ICD-10-CM | POA: Diagnosis not present

## 2019-06-01 DIAGNOSIS — Z21 Asymptomatic human immunodeficiency virus [HIV] infection status: Secondary | ICD-10-CM

## 2019-06-01 DIAGNOSIS — B20 Human immunodeficiency virus [HIV] disease: Secondary | ICD-10-CM

## 2019-06-01 NOTE — Progress Notes (Signed)
Subjective:    Patient ID: Courtney Kim is a 54 y.o. female     DOB: 1965/11/02   MRN: [id number]   Patient Active Problem List   Diagnosis Date Noted  . Symptomatic HIV infection (HCC) 05/05/2019  . Unintentional weight loss 05/05/2019  . Lymphadenopathy of right cervical region 05/05/2019     No chief complaint on file.     HPI [name] states she is feeling much better and have more energy since starting ART and believes the medication is working well. Patient is on Biktarvy and reports good adherence and no side effects. No issues getting medications. Patient states she has been dealing with the HIV diagnosis and is talking to God as a coping mechanism. Educated patient on the clinic resource of counseling if she needs it.   Only concerns is seasonal allergies with nasal congestion. She has been using flonase with no improvement. Recommended other OTC allergy medicine and to continue OTC flonase. More information given in the AVS.   Discussed last lab results and their significance.   Will discuss immunizations at next visit once CD4 count is above 200.   Review of Systems  Constitutional: Negative for chills, fever and malaise/fatigue.  HENT: Positive for congestion.   Respiratory: Negative for cough, shortness of breath and wheezing.   Cardiovascular: Negative for chest pain, palpitations and leg swelling.  Gastrointestinal: Negative for abdominal pain, nausea and vomiting.  Musculoskeletal: Negative for myalgias.  Skin: Negative for rash.  Neurological: Negative for weakness and headaches.  Psychiatric/Behavioral: The patient is not nervous/anxious.     Outpatient Medications Prior to Visit  Medication Sig Dispense Refill  . bictegravir-emtricitabine-tenofovir AF (BIKTARVY) 50-200-25 MG TABS tablet Take 1 tablet by mouth daily. Try to take at the same time each day with or without food. 30 tablet 5  . ibuprofen (ADVIL) 200 MG tablet Take 200 mg by mouth every 6  (six) hours as needed.    . sulfamethoxazole-trimethoprim (BACTRIM) 400-80 MG tablet Take 1 tablet by mouth daily. 30 tablet 5   No facility-administered medications prior to visit.    Past Medical History:  Diagnosis Date  . HIV infection (HCC)     Family History  Problem Relation Age of Onset  . Hypertension Mother   . Sickle cell anemia Father   . Breast cancer Paternal Grandmother     Social History   Socioeconomic History  . Marital status: Single    Spouse name: Not on file  . Number of children: Not on file  . Years of education: Not on file  . Highest education level: Not on file  Occupational History  . Not on file  Tobacco Use  . Smoking status: Current Some Day Smoker    Types: Cigarettes  . Smokeless tobacco: Current User  Substance and Sexual Activity  . Alcohol use: Never  . Drug use: Never  . Sexual activity: Not Currently    Partners: Male  Other Topics Concern  . Not on file  Social History Narrative   Leave alone   Smoke cigarette occasion   Social Determinants of Health   Financial Resource Strain:   . Difficulty of Paying Living Expenses:   Food Insecurity:   . Worried About Programme researcher, broadcasting/film/video in the Last Year:   . Barista in the Last Year:   Transportation Needs:   . Freight forwarder (Medical):   Marland Kitchen Lack of Transportation (Non-Medical):   Physical Activity:   .  Days of Exercise per Week:   . Minutes of Exercise per Session:   Stress:   . Feeling of Stress :   Social Connections:   . Frequency of Communication with Friends and Family:   . Frequency of Social Gatherings with Friends and Family:   . Attends Religious Services:   . Active Member of Clubs or Organizations:   . Attends Archivist Meetings:   Marland Kitchen Marital Status:   Intimate Partner Violence:   . Fear of Current or Ex-Partner:   . Emotionally Abused:   Marland Kitchen Physically Abused:   . Sexually Abused:          Objective:    Today's Vitals    06/01/19 1037  BP: (!) 149/92  Pulse: 94  Temp: 98.1 F (36.7 C)  TempSrc: Oral  Weight: 135 lb (61.2 kg)  PainSc: 0-No pain   Body mass index is 19.94 kg/m.  Physical Exam HENT:     Head: Normocephalic.     Nose: Congestion present.  Eyes:     Extraocular Movements: Extraocular movements intact.     Pupils: Pupils are equal, round, and reactive to light.  Cardiovascular:     Rate and Rhythm: Normal rate and regular rhythm.     Heart sounds: Normal heart sounds.  Pulmonary:     Effort: Pulmonary effort is normal.     Breath sounds: Normal breath sounds.  Abdominal:     General: Abdomen is flat.  Musculoskeletal:        General: Normal range of motion.     Cervical back: Normal range of motion.  Lymphadenopathy:     Cervical: Cervical adenopathy (right cervical adenopaty significantly improved/decreased in size since last visit) present.  Skin:    General: Skin is warm and dry.  Neurological:     Mental Status: She is alert and oriented to person, place, and time.  Psychiatric:        Mood and Affect: Mood normal.        Behavior: Behavior normal.        Thought Content: Thought content normal.        Judgment: Judgment normal.      LABS: Lab Results  Component Value Date   HIV1RNAQUANT 1,020 (H) 05/18/2019   HIV1RNAQUANT 339,000 (H) 05/05/2019    Lab Results  Component Value Date   CREATININE 0.84 05/21/2019   CREATININE 0.56 05/05/2019    Lab Results  Component Value Date   ALT 12 05/21/2019   AST 22 05/21/2019   ALKPHOS 64 05/21/2019   BILITOT 0.5 05/21/2019    Lab Results  Component Value Date   WBC 3.2 (L) 05/21/2019   HGB 9.7 (L) 05/21/2019   HCT 29.7 (L) 05/21/2019   MCV 86.1 05/21/2019   PLT 224 05/21/2019    No results found for: RPR      Assessment & Plan:   Problem List Items Addressed This Visit      Immune and Lymphatic   Lymphadenopathy of right cervical region    Patient with right cervical lymphadenopathy that has  significantly improved/decreased in size since last visit. Patient saw oncologist on 05/21/19 and had an ultrasound done on 4/721. Results are below.   IMPRESSION: Patient's palpable area of concern involving the right posterolateral neck correlates with several adjacent prominent, though non pathologically enlarged subcutaneous lymph nodes, nonspecific though potentially reactive in etiology. Clinical correlation to resolution is advised.        Other   Symptomatic  HIV infection [facility])    Patient reports that she is doing well and feeling better since starting treatment with Biktarvy. Last lab work on 05/18/19 showed HIV viral load of 1020 and CD4 count was 152. This showed significant improvement from previous lab work on 05/05/19. Continue taking Biktarvy 1 tablet daily. Continue Bactrim 1 tablet daily.   Patient states she has been dealing with the diagnosis of HIV better and have been using prayer as coping mechanism. Encouraged patient to continue on with her spirituality. Educated patient on the clinic counseling resource if she needs it, she declined.   Patient declined needing condoms today and reported that she is not sexually active. Educated patient on U=U and how to become undetectable with ART regimen.   Follow up in 3 months or sooner if needed with blood work 2 weeks prior to visit.        Other Visit Diagnoses    Asymptomatic HIV infection (HCC)    -  Primary   Relevant Orders   HIV-1 RNA quant-no reflex-bld   T-helper cell (CD4)- ([facility] clinic only)        Luvenia Starch FNP Student [facility]

## 2019-06-01 NOTE — Assessment & Plan Note (Signed)
Patient with right cervical lymphadenopathy that has significantly improved/decreased in size since last visit. Patient saw oncologist on 05/21/19 and had an ultrasound done on 4/721. Results are below.   IMPRESSION: Patient's palpable area of concern involving the right posterolateral neck correlates with several adjacent prominent, though non pathologically enlarged subcutaneous lymph nodes, nonspecific though potentially reactive in etiology. Clinical correlation to resolution is advised.

## 2019-06-01 NOTE — Assessment & Plan Note (Addendum)
Patient reports that she is doing well and feeling better since starting treatment with Biktarvy. Last lab work on 05/18/19 showed HIV viral load of 1020 and CD4 count was 152. This showed significant improvement from previous lab work on 05/05/19. Continue taking Biktarvy 1 tablet daily. Continue Bactrim 1 tablet daily.   Patient states she has been dealing with the diagnosis of HIV better and have been using prayer as coping mechanism. Encouraged patient to continue on with her spirituality. Educated patient on the clinic counseling resource if she needs it, she declined.   Patient declined needing condoms today and reported that she is not sexually active. Educated patient on U=U and how to become undetectable with ART regimen.   Follow up in 3 months or sooner if needed with blood work 2 weeks prior to visit.

## 2019-06-01 NOTE — Patient Instructions (Addendum)
Please continue taking your Biktarvy every day.  Please continue taking Bactrim everyday as well until your next visit.   Please come back in 3 months with labs 2 weeks before your visit.    For your allergies: General Recommendations:    Please drink plenty of fluids (coffee, caffeine-containing teas/beverages and alcohol do not count) - Water helps thin the mucus so your sinuses can drain more easily.  Get plenty of rest   Sleep in humidified air with the use of a humidifier in bedroom or commonly occupied areas of home  Inhale steam 3 to 4 times a day (for example, sit in the bathroom with the shower running) to help with facial pain  Saline nose sprays or a netti pot  Avoid allergies - keep windows closed, change air filters in the home, vacuum rugs/carpets frequently  Throat lozenges like Halls for sore throat - Biotene mouth rinses if dry mouth    Over the Counter Medications:  Decongestants - medications that help to helps relieve congestion ("stuffy nose")   Flonase (generic fluticasone) or Nasacort (generic triamcinolone) - this takes at least 1 week to work but would recommend especially for those that have allergies  Sudafed (generic pseudoephedrine - Note this is the one that is available behind the pharmacy counter)  Products with "-D" on the label mean they already contain pseudoephedrine (Mucinex-D or Zyrtec-D, for example).   Products with phenylephrine (-PE) may also be used but is often not as effective as pseudoephedrine.   If you have HIGH BLOOD PRESSURE - Coricidin HBP should be the only decongestant you choose to use.   AVOID any product that is -D as this contains pseudoephedrine or has a "-D" - this has the potential to significantly increase your blood pressure  Afrin (oxymetazoline) every 6-8 hours for up to 3 days ONLY  Sinex Nasal Spray for up to 3 days ONLY  Allergie Medicine - relieves runny nose, itchy/watering eyes and  sneezing   Claritin (generic loratidine), Allegra (fexofenidine), or Zyrtec (generic cyrterizine) for runny nose. These medications should not cause you to be sleepy. These are taken once a day.   Benadryl (generic diphenhydramine) may also be used if symptoms are severe - this likely will cause drowsiness/tiredness/foggy head so try to use only at night.   Allergies happen all year round not just over the spring - many people are troubled in the winter season as well.  Keep windows closed, rotate air filters regularly, reduce exposure to any triggers (pet hair, dust, weeds/grasses, etc)   Cough -   Delsym or Robitussin (generic dextromethorphan)  Products with "-DM" contain dextromethorphan ingredient for cough (Mucinex-DM, for example)  Honey, straight-up  Lemon water  Hot tea   Ginger   Vics Vapor Rub   Expectorants - helps loosen mucus to make it easier to cough up from chest    Mucinex (generic guaifenesin) as directed on the package  Fluids!  Headaches / General Aches   Tylenol (generic acetaminophen) - DO NOT EXCEED 3 grams (3,000 mg) in a 24 hour time period (Take 2 extra strength tablets 6 hours apart for 3 doses)  Advil/Motrin (generic ibuprofen) - take 2 tablets every 8 hours   Aleve (generic naproxen) - do not combine if taking ibuprofen/Advil/Motrin  If you have high blood pressure use Tylenol to treat pain/headaches   Sore Throat -   Salt water gargle   Chloraseptic (generic benzocaine) spray or lozenges / Sucrets (generic dyclonine)  Halls throat lozenges

## 2019-06-02 MED FILL — SULFAMETHOXAZOLE-TMP SS TAB: 400-80 | 30 days supply | Qty: 30 | Fill #1

## 2019-06-02 MED FILL — BIKTARVY 50-200-25 MG TABS: 50-200-25 | 30 days supply | Qty: 30 | Fill #1

## 2019-06-03 ENCOUNTER — Telehealth: Payer: Self-pay | Admitting: *Deleted

## 2019-06-03 NOTE — Telephone Encounter (Signed)
TCT patient regarding results of her neck US. No answer and no vm availablity. Unable to leave message or call back #

## 2019-06-03 NOTE — Telephone Encounter (Signed)
-----   Message from Jaci Standard, MD sent at 06/03/2019  8:47 AM EDT ----- Please call Courtney Kim to let her know that her US of the neck confirmed the masses were lymph nodes. She has been seen by ID again who noted with restarting her HIV therapy the nodes have decreased in size.  We will have her return in June 2021 to re-examine and check her blood counts. No further intervention is required in the meantime.  Courtney Kim  ----- Message ----- From: Leory Plowman, Rad Results In Sent: 05/27/2019   2:53 PM EDT To: Jaci Standard, MD

## 2019-07-21 ENCOUNTER — Telehealth: Payer: Self-pay | Admitting: Pharmacy Technician

## 2019-07-21 NOTE — Telephone Encounter (Signed)
[facility] Patient Advocate Encounter   I spoke with the patient and she will bring in proof of income tomorrow morning.  I will ask her to sign the [employer]'s application to apply for additional copay assistance.  We will continue to follow.  Netty Starring. Dimas Aguas CPhT Specialty Pharmacy Patient [facility] Phone: [phone] Fax:  [phone]

## 2019-07-23 ENCOUNTER — Other Ambulatory Visit: Payer: Self-pay | Admitting: Hematology and Oncology

## 2019-07-23 ENCOUNTER — Inpatient Hospital Stay: Payer: 59

## 2019-07-23 ENCOUNTER — Inpatient Hospital Stay: Payer: 59 | Attending: Hematology and Oncology | Admitting: Hematology and Oncology

## 2019-07-23 ENCOUNTER — Other Ambulatory Visit: Payer: Self-pay

## 2019-07-23 VITALS — BP 142/95 | HR 87 | Temp 98.1°F | Resp 18 | Wt 146.0 lb

## 2019-07-23 DIAGNOSIS — R590 Localized enlarged lymph nodes: Secondary | ICD-10-CM

## 2019-07-23 DIAGNOSIS — E8809 Other disorders of plasma-protein metabolism, not elsewhere classified: Secondary | ICD-10-CM | POA: Insufficient documentation

## 2019-07-23 DIAGNOSIS — D72819 Decreased white blood cell count, unspecified: Secondary | ICD-10-CM | POA: Diagnosis not present

## 2019-07-23 DIAGNOSIS — D649 Anemia, unspecified: Secondary | ICD-10-CM | POA: Diagnosis not present

## 2019-07-23 DIAGNOSIS — D500 Iron deficiency anemia secondary to blood loss (chronic): Secondary | ICD-10-CM

## 2019-07-23 DIAGNOSIS — F17210 Nicotine dependence, cigarettes, uncomplicated: Secondary | ICD-10-CM | POA: Diagnosis not present

## 2019-07-23 DIAGNOSIS — D696 Thrombocytopenia, unspecified: Secondary | ICD-10-CM

## 2019-07-23 DIAGNOSIS — Z21 Asymptomatic human immunodeficiency virus [HIV] infection status: Secondary | ICD-10-CM | POA: Insufficient documentation

## 2019-07-23 LAB — CBC WITH DIFFERENTIAL (CANCER CENTER ONLY)
Abs Immature Granulocytes: 0.01 10*3/uL (ref 0.00–0.07)
Basophils Absolute: 0 10*3/uL (ref 0.0–0.1)
Basophils Relative: 1 %
Eosinophils Absolute: 0.1 10*3/uL (ref 0.0–0.5)
Eosinophils Relative: 2 %
HCT: 31.5 % — ABNORMAL LOW (ref 36.0–46.0)
Hemoglobin: 10.2 g/dL — ABNORMAL LOW (ref 12.0–15.0)
Immature Granulocytes: 0 %
Lymphocytes Relative: 38 %
Lymphs Abs: 1.2 10*3/uL (ref 0.7–4.0)
MCH: 28.2 pg (ref 26.0–34.0)
MCHC: 32.4 g/dL (ref 30.0–36.0)
MCV: 87 fL (ref 80.0–100.0)
Monocytes Absolute: 0.3 10*3/uL (ref 0.1–1.0)
Monocytes Relative: 8 %
Neutro Abs: 1.6 10*3/uL — ABNORMAL LOW (ref 1.7–7.7)
Neutrophils Relative %: 51 %
Platelet Count: 239 10*3/uL (ref 150–400)
RBC: 3.62 MIL/uL — ABNORMAL LOW (ref 3.87–5.11)
RDW: 13.5 % (ref 11.5–15.5)
WBC Count: 3.1 10*3/uL — ABNORMAL LOW (ref 4.0–10.5)
nRBC: 0 % (ref 0.0–0.2)

## 2019-07-23 LAB — LACTATE DEHYDROGENASE: LDH: 111 U/L (ref 98–192)

## 2019-07-23 LAB — CMP (CANCER CENTER ONLY)
ALT: 8 U/L (ref 0–44)
AST: 16 U/L (ref 15–41)
Albumin: 3.6 g/dL (ref 3.5–5.0)
Alkaline Phosphatase: 76 U/L (ref 38–126)
Anion gap: 7 (ref 5–15)
BUN: 12 mg/dL (ref 6–20)
CO2: 28 mmol/L (ref 22–32)
Calcium: 9.4 mg/dL (ref 8.9–10.3)
Chloride: 103 mmol/L (ref 98–111)
Creatinine: 1.3 mg/dL — ABNORMAL HIGH (ref 0.44–1.00)
GFR, Est AFR Am: 54 mL/min — ABNORMAL LOW (ref >60)
GFR, Estimated: 47 mL/min — ABNORMAL LOW (ref >60)
Glucose, Bld: 119 mg/dL — ABNORMAL HIGH (ref 70–99)
Potassium: 4.7 mmol/L (ref 3.5–5.1)
Sodium: 138 mmol/L (ref 135–145)
Total Bilirubin: 0.4 mg/dL (ref 0.3–1.2)
Total Protein: 9.3 g/dL — ABNORMAL HIGH (ref 6.5–8.1)

## 2019-07-23 LAB — RETIC PANEL
Immature Retic Fract: 7.3 % (ref 2.3–15.9)
RBC.: 3.58 MIL/uL — ABNORMAL LOW (ref 3.87–5.11)
Retic Count, Absolute: 56.2 10*3/uL (ref 19.0–186.0)
Retic Ct Pct: 1.6 % (ref 0.4–3.1)
Reticulocyte Hemoglobin: 33.7 pg (ref >27.9)

## 2019-07-23 MED ORDER — FERROUS SULFATE 325 (65 FE) MG PO TABS: 325.0000 mg | ORAL_TABLET | Freq: Every day | ORAL | 3 refills | Status: AC

## 2019-07-24 ENCOUNTER — Telehealth: Payer: Self-pay | Admitting: Hematology and Oncology

## 2019-07-24 NOTE — Telephone Encounter (Signed)
Scheduled per los. Called and left msg. Mailed printout  °

## 2019-07-25 ENCOUNTER — Encounter: Payer: Self-pay | Admitting: Hematology and Oncology

## 2019-07-25 NOTE — Progress Notes (Signed)
[place] [phone]   [phone]  PROGRESS NOTE  Patient Care Team: Sueanne Margarita, DO as PCP - General (Internal Medicine)  Hematological/Oncological History # Hyperproteinemia 1) 05/05/2019: Protein 9.9 on routine CMP.  2) 05/21/2019: establish care with Dr. Lorenso Courier. Protein 11.1. SPEP showed polyclonal increase detected in one or more immunoglobulins 3) 07/23/2019: Protein 9.3.   #Right Cervical Lymphadenopathy  1) patient notes it has been present since time of HIV diagnosis in 2010 2) 05/21/2019: establish care with Dr. Lorenso Courier  3) 05/27/2019: Patient's palpable area of concern involving the right posterolateral neck correlates with several adjacent prominent, though non pathologically enlarged subcutaneous lymph nodes  #Normocytic Anemia 1) 10/03/2016: WBC 3.1, Hgb 10.5, Plt 224, MCV 85 2) 05/05/2019: WBC 2.3, Hgb 9.4, MCV 84.9, Plt 147. CD4 count 53 3) 05/21/2019: establish care with Dr. Lorenso Courier. Iron sat 14%, ferritin 218 4) 07/23/2019: WBC 3.1, Hgb 10.2, Plt 239, MCV 87.0  Interval History:  Courtney Kim 54 y.o. female with medical history significant for normocytic anemia and chronic right cervical lymphadenopathy who presents for a follow up visit. The patient's last visit was on 05/21/2019 at which time she established care. In the interim since the last visit has had no major changes in her health.  On exam today Courtney Kim notes that she is learning to deal with her diagnosis of HIV better.  She has had significant weight gain and is 146 pounds up from 135 pounds on her initial visit.  She has had an increase in appetite.  She reports that the lymph node in her neck has remained stable in size and does not cause her much in the way of discomfort.  She notes that it does not cause her any discomfort and she continues to deny having any issues with fevers, chills, sweats, nausea, vomiting or diarrhea.  In terms of her iron deficiency the patient  notes that she had a partial hysterectomy, but prior to that she had quite heavy periods.  She notes that she has not had any periods for the last 15 to 20 years, but at the time when they were occurring she would go through approximately 1 pad every 1-2 hours due to saturation.  She has never been on iron supplementation and her diet is not particularly high in iron.  She otherwise denies any overt signs of bleeding such as dark stools, nosebleeds, or bruising.  A full 10 point ROS is listed below.  MEDICAL HISTORY:  Past Medical History:  Diagnosis Date  . HIV infection ([place])     SURGICAL HISTORY: Past Surgical History:  Procedure Laterality Date  . ABDOMINAL HYSTERECTOMY      SOCIAL HISTORY: Social History   Socioeconomic History  . Marital status: Single    Spouse name: Not on file  . Number of children: Not on file  . Years of education: Not on file  . Highest education level: Not on file  Occupational History  . Not on file  Tobacco Use  . Smoking status: Current Some Day Smoker    Types: Cigarettes  . Smokeless tobacco: Current User  Substance and Sexual Activity  . Alcohol use: Never  . Drug use: Never  . Sexual activity: Not Currently    Partners: Male  Other Topics Concern  . Not on file  Social History Narrative   Leave alone   Smoke cigarette occasion   Social Determinants of Health   Financial Resource Strain:   . Difficulty of Paying  Living Expenses:   Food Insecurity:   . Worried About Charity fundraiser in the Last Year:   . Arboriculturist in the Last Year:   Transportation Needs:   . Film/video editor (Medical):   Marland Kitchen Lack of Transportation (Non-Medical):   Physical Activity:   . Days of Exercise per Week:   . Minutes of Exercise per Session:   Stress:   . Feeling of Stress :   Social Connections:   . Frequency of Communication with Friends and Family:   . Frequency of Social Gatherings with Friends and Family:   . Attends Religious  Services:   . Active Member of Clubs or Organizations:   . Attends Archivist Meetings:   Marland Kitchen Marital Status:   Intimate Partner Violence:   . Fear of Current or Ex-Partner:   . Emotionally Abused:   Marland Kitchen Physically Abused:   . Sexually Abused:     FAMILY HISTORY: Family History  Problem Relation Age of Onset  . Hypertension Mother   . Sickle cell anemia Father   . Breast cancer Paternal Grandmother     ALLERGIES:  is allergic to hydrocodone-acetaminophen.  MEDICATIONS:  Current Outpatient Medications  Medication Sig Dispense Refill  . bictegravir-emtricitabine-tenofovir AF (BIKTARVY) 50-200-25 MG TABS tablet Take 1 tablet by mouth daily. Try to take at the same time each day with or without food. 30 tablet 5  . ferrous sulfate 325 (65 FE) MG tablet Take 1 tablet (325 mg total) by mouth daily with breakfast. Please take with a source of Vitamin C 90 tablet 3  . ibuprofen (ADVIL) 200 MG tablet Take 200 mg by mouth every 6 (six) hours as needed.    . sulfamethoxazole-trimethoprim (BACTRIM) 400-80 MG tablet Take 1 tablet by mouth daily. 30 tablet 5   No current facility-administered medications for this visit.    REVIEW OF SYSTEMS:   Constitutional: ( - ) fevers, ( - )  chills , ( - ) night sweats Eyes: ( - ) blurriness of vision, ( - ) double vision, ( - ) watery eyes Ears, nose, mouth, throat, and face: ( - ) mucositis, ( - ) sore throat Respiratory: ( - ) cough, ( - ) dyspnea, ( - ) wheezes Cardiovascular: ( - ) palpitation, ( - ) chest discomfort, ( - ) lower extremity swelling Gastrointestinal:  ( - ) nausea, ( - ) heartburn, ( - ) change in bowel habits Skin: ( - ) abnormal skin rashes Lymphatics: ( - ) new lymphadenopathy, ( - ) easy bruising Neurological: ( - ) numbness, ( - ) tingling, ( - ) new weaknesses Behavioral/Psych: ( - ) mood change, ( - ) new changes  All other systems were reviewed with the patient and are negative.  PHYSICAL EXAMINATION: ECOG  PERFORMANCE STATUS: 0 - Asymptomatic  Vitals:   07/23/19 1520  BP: (!) 142/95  Pulse: 87  Resp: 18  Temp: 98.1 F (36.7 C)  SpO2: 98%   Filed Weights   07/23/19 1520  Weight: 146 lb (66.2 kg)    GENERAL: well appearing middle aged [place] American female. alert, no distress and comfortable SKIN: skin color, texture, turgor are normal, no rashes or significant lesions EYES: conjunctiva are pink and non-injected, sclera clear OROPHARYNX: no exudate, no erythema; lips, buccal mucosa, and tongue normal  NECK: supple, non-tender LYMPH:  Single lymph node in the cervical LUNGS: clear to auscultation and percussion with normal breathing effort HEART: regular rate & rhythm  and no murmurs and no lower extremity edema Musculoskeletal: no cyanosis of digits and no clubbing  PSYCH: alert & oriented x 3, fluent speech NEURO: no focal motor/sensory deficits  LABORATORY DATA:  I have reviewed the data as listed CBC Latest Ref Rng & Units 07/23/2019 05/21/2019 05/05/2019  WBC 4.0 - 10.5 K/uL 3.1(L) 3.2(L) 2.3(L)  Hemoglobin 12.0 - 15.0 g/dL 10.2(L) 9.7(L) 9.4(L)  Hematocrit 36.0 - 46.0 % 31.5(L) 29.7(L) 29.2(L)  Platelets 150 - 400 K/uL 239 224 147    CMP Latest Ref Rng & Units 07/23/2019 05/21/2019 05/05/2019  Glucose 70 - 99 mg/dL 119(H) 85 69  BUN 6 - 20 mg/dL 12 21(H) 12  Creatinine 0.44 - 1.00 mg/dL 1.30(H) 0.84 0.56  Sodium 135 - 145 mmol/L 138 134(L) 136  Potassium 3.5 - 5.1 mmol/L 4.7 4.1 3.8  Chloride 98 - 111 mmol/L 103 103 105  CO2 22 - 32 mmol/L '28 25 29  '$ Calcium 8.9 - 10.3 mg/dL 9.4 9.5 9.2  Total Protein 6.5 - 8.1 g/dL 9.3(H) 11.1(H) 9.9(H)  Total Bilirubin 0.3 - 1.2 mg/dL 0.4 0.5 0.5  Alkaline Phos 38 - 126 U/L 76 64 -  AST 15 - 41 U/L 16 22 32  ALT 0 - 44 U/L '8 12 21    '$ Lab Results  Component Value Date   MPROTEIN Not Observed 05/21/2019   Lab Results  Component Value Date   KPAFRELGTCHN 109.2 (H) 05/21/2019   LAMBDASER 46.8 (H) 05/21/2019   KAPLAMBRATIO 2.33 (H)  05/21/2019     RADIOGRAPHIC STUDIES: No results found.  ASSESSMENT & PLAN Kaithlyn Teagle 54 y.o. female with medical history significant for normocytic anemia and chronic right cervical lymphadenopathy who presents for a follow up visit.  The interim since her last visit the patient has had an ultrasound of the neck which confirmed that the finding is a nonpathologically enlarged lymph node.  Given his relative stability and lack of other symptoms I would recommend continued monitoring at this time.  In terms the patient's hyperproteinemia she was found to have a polyclonal increase in immunoglobulins and additionally was found to have a elevated ESR at 105.  I would expect with treatment of HIV that these would begin to trend downward.  Unfortunately in the interim the patient's hemoglobin has not improved.  On prior evaluation the patient is found to have iron sat of 14% with an elevated ferritin.  Additionally the patient did have that elevated sed rate which could have falsely elevated the ferritin.  I think she may benefit from p.o. iron therapy given her past history of heavy menstrual bleeding.  We will have her return to clinic in approximately 3 months time to assure that all of her hematological abnormalities are trending in the right direction.  #Hyperproteinemia -- SPEP, SFLC show no evidence of monoclonal gammopathy, though there is a polyclonal increase in immunoglobulins.  --continue to monitor, would expect improvement on HIV treatment   #Isolated Lymphadenopathy --enlarged right sided cervical lymph node, reportedly stable x 10 years --non-tender, not enlarging -- US of the neck confirmed it is a non-pathologically enlarged lymph node --can consider core biopsy of the lymph node, however given its relative stability over the last 10 years it is highly likely to be benign in nature --continue to monitor  #Normocytic Anemia #Leukopenia --treatment of HIV only minimally  improved anemia. At last assessment the patient had a mildly decreased Iron sat at 14% (ferritin normal, but ESR elevated) --will order ferrous sulfate '325mg'$   PO daily.  --poorly controlled HIV can also contribute to anemia, particularly in African American females (J Acquir Immune Defic Syndr. 2001 Jan 1;26(1):28-35).  --RTC in 3 months while on iron supplementation.   No orders of the defined types were placed in this encounter.  All questions were answered. The patient knows to call the clinic with any problems, questions or concerns.  A total of more than 30 minutes were spent on this encounter and over half of that time was spent on counseling and coordination of care as outlined above.   Ledell Peoples, MD Department of Hematology/Oncology [place] [facility] Phone: [phone] Pager: [phone] Email: [email or web address]  07/25/2019 4:14 PM

## 2019-07-30 ENCOUNTER — Telehealth: Payer: Self-pay | Admitting: Pharmacy Technician

## 2019-07-30 NOTE — Telephone Encounter (Addendum)
[facility] Patient Advocate Encounter  Income documents uploaded to the PAF portal. Patient also applied for ICAP while in the office to help cover the cost of medications and office visits. Will update the patient when we hear determination.   Patient was provided 14 tablets while the applications are processing. She noted that she is feeling way better on this medication, gaining back weight and has not missed any doses.   She has privacy concerns and does not want her employer to find out her diagnosis. If approved for PAF we can also apply for bill payment reimbursement on the portal for her office visits that occurred before the RW coverage.   I was under the impression [employer] was medicare only at least the online portal is medicare only so I did not submit that application to their organization.

## 2019-07-31 ENCOUNTER — Encounter: Payer: Self-pay | Admitting: Infectious Diseases

## 2019-08-11 ENCOUNTER — Other Ambulatory Visit: Payer: Self-pay | Admitting: Pharmacist

## 2019-08-11 DIAGNOSIS — Z21 Asymptomatic human immunodeficiency virus [HIV] infection status: Secondary | ICD-10-CM

## 2019-08-11 DIAGNOSIS — B20 Human immunodeficiency virus [HIV] disease: Secondary | ICD-10-CM

## 2019-08-11 MED ORDER — BICTEGRAVIR-EMTRICITAB-TENOFOV 50-200-25 MG PO TABS: 1.0000 | ORAL_TABLET | Freq: Every day | ORAL | 5 refills | Status: DC

## 2019-08-11 MED ORDER — SULFAMETHOXAZOLE-TRIMETHOPRIM 400-80 MG PO TABS: 1.0000 | ORAL_TABLET | Freq: Every day | ORAL | 5 refills | Status: DC

## 2019-08-19 ENCOUNTER — Emergency Department (HOSPITAL_COMMUNITY): Payer: 59

## 2019-08-19 ENCOUNTER — Encounter (HOSPITAL_COMMUNITY): Payer: Self-pay | Admitting: Emergency Medicine

## 2019-08-19 ENCOUNTER — Other Ambulatory Visit: Payer: Self-pay

## 2019-08-19 ENCOUNTER — Emergency Department (HOSPITAL_COMMUNITY)
Admission: EM | Admit: 2019-08-19 | Discharge: 2019-08-19 | Disposition: A | Discharge status: Home / Self Care | Payer: 59 | Attending: Emergency Medicine | Admitting: Emergency Medicine

## 2019-08-19 DIAGNOSIS — Z20822 Contact with and (suspected) exposure to covid-19: Secondary | ICD-10-CM | POA: Insufficient documentation

## 2019-08-19 DIAGNOSIS — I10 Essential (primary) hypertension: Secondary | ICD-10-CM | POA: Insufficient documentation

## 2019-08-19 DIAGNOSIS — F17210 Nicotine dependence, cigarettes, uncomplicated: Secondary | ICD-10-CM | POA: Insufficient documentation

## 2019-08-19 DIAGNOSIS — Z79899 Other long term (current) drug therapy: Secondary | ICD-10-CM | POA: Diagnosis not present

## 2019-08-19 DIAGNOSIS — Z7982 Long term (current) use of aspirin: Secondary | ICD-10-CM | POA: Diagnosis not present

## 2019-08-19 DIAGNOSIS — R072 Precordial pain: Secondary | ICD-10-CM | POA: Diagnosis present

## 2019-08-19 HISTORY — DX: Essential (primary) hypertension: I10

## 2019-08-19 LAB — CBC
HCT: 34.2 % — ABNORMAL LOW (ref 36.0–46.0)
Hemoglobin: 11.2 g/dL — ABNORMAL LOW (ref 12.0–15.0)
MCH: 29.5 pg (ref 26.0–34.0)
MCHC: 32.7 g/dL (ref 30.0–36.0)
MCV: 90 fL (ref 80.0–100.0)
Platelets: 239 10*3/uL (ref 150–400)
RBC: 3.8 MIL/uL — ABNORMAL LOW (ref 3.87–5.11)
RDW: 13.2 % (ref 11.5–15.5)
WBC: 3 10*3/uL — ABNORMAL LOW (ref 4.0–10.5)
nRBC: 0 % (ref 0.0–0.2)

## 2019-08-19 LAB — BASIC METABOLIC PANEL
Anion gap: 8 (ref 5–15)
BUN: 9 mg/dL (ref 6–20)
CO2: 26 mmol/L (ref 22–32)
Calcium: 8.8 mg/dL — ABNORMAL LOW (ref 8.9–10.3)
Chloride: 106 mmol/L (ref 98–111)
Creatinine, Ser: 0.56 mg/dL (ref 0.44–1.00)
GFR calc Af Amer: >60 mL/min (ref >60)
GFR calc non Af Amer: >60 mL/min (ref >60)
Glucose, Bld: 88 mg/dL (ref 70–99)
Potassium: 3.8 mmol/L (ref 3.5–5.1)
Sodium: 140 mmol/L (ref 135–145)

## 2019-08-19 LAB — TROPONIN I (HIGH SENSITIVITY)
Troponin I (High Sensitivity): 3 ng/L (ref <18)
Troponin I (High Sensitivity): 4 ng/L (ref <18)

## 2019-08-19 MED ORDER — KETOROLAC TROMETHAMINE 30 MG/ML IJ SOLN
15.0000 mg | Freq: Once | INTRAMUSCULAR | Status: AC
  Administered 2019-08-19: 15 mg via INTRAVENOUS
  Filled 2019-08-19: qty 1

## 2019-08-19 MED ORDER — NAPROXEN 375 MG PO TABS: 375.0000 mg | ORAL_TABLET | Freq: Two times a day (BID) | ORAL | 0 refills | Status: DC

## 2019-08-19 NOTE — ED Provider Notes (Signed)
Courtney Kim   CSN: [id number] Arrival date & time: 08/19/19  1356     History No chief complaint on file.   Courtney Kim is a 54 y.o. female.  HPI  HPI: A 54 year old patient with a history of hypertension and hypercholesterolemia presents for evaluation of chest pain. Initial onset of pain was more than 6 hours ago. The patient's chest pain is sharp and is not worse with exertion. The patient's chest pain is middle- or left-sided, is not well-localized, is not described as heaviness/pressure/tightness and does not radiate to the arms/jaw/neck. The patient does not complain of nausea and denies diaphoresis. The patient has smoked in the past 90 days. The patient has no history of stroke, has no history of peripheral artery disease, denies any history of treated diabetes, has no relevant family history of coronary artery disease (first degree relative at less than age 54) and does not have an elevated BMI (>=30).   Patient appears to have history of HIV and hypertension.  She reports having sharp chest pain for the last 3 days.  Pain is midsternal and it is worse when she is laying flat.  She denies any exertional component or pleuritic component of the pain.  No history of similar pain in the past.  Patient denies any recent viral illnesses.  Past Medical History:  Diagnosis Date  . HIV infection ([facility])   . Hypertension     Patient Active Problem List   Diagnosis Date Noted  . Symptomatic HIV infection ([facility]) 05/05/2019  . Unintentional weight loss 05/05/2019  . Lymphadenopathy of right cervical region 05/05/2019    Past Surgical History:  Procedure Laterality Date  . ABDOMINAL HYSTERECTOMY       OB History   No obstetric history on file.     Family History  Problem Relation Age of Onset  . Hypertension Mother   . Sickle cell anemia Father   . Breast cancer Paternal Grandmother     Social History   Tobacco Use  .  Smoking status: Current Some Day Smoker    Types: Cigarettes  . Smokeless tobacco: Current User  Substance Use Topics  . Alcohol use: Never  . Drug use: Never    Home Medications Prior to Admission medications   Medication Sig Start Date End Date Taking? Authorizing Provider  aspirin EC 81 MG tablet Take 81 mg by mouth daily. Swallow whole.   Yes [provider]  bictegravir-emtricitabine-tenofovir AF (BIKTARVY) 50-200-25 MG TABS tablet Take 1 tablet by mouth daily. Try to take at the same time each day with or without food. 08/11/19  Yes Kuppelweiser, Cassie L, RPH-CPP  ferrous sulfate 325 (65 FE) MG tablet Take 1 tablet (325 mg total) by mouth daily with breakfast. Please take with a source of Vitamin C 07/23/19  Yes Jaci Standard, MD  ibuprofen (ADVIL) 200 MG tablet Take 200 mg by mouth every 6 (six) hours as needed.   Yes [provider]  sulfamethoxazole-trimethoprim (BACTRIM) 400-80 MG tablet Take 1 tablet by mouth daily. 08/11/19  Yes Kuppelweiser, Cassie L, RPH-CPP  naproxen (NAPROSYN) 375 MG tablet Take 1 tablet (375 mg total) by mouth 2 (two) times daily. 08/19/19   Derwood Kaplan, MD    Allergies    Hydrocodone-acetaminophen  Review of Systems   Review of Systems  Constitutional: Positive for activity change.  Respiratory: Negative for shortness of breath.   Cardiovascular: Positive for chest pain.  Gastrointestinal: Negative for nausea  and vomiting.  All other systems reviewed and are negative.   Physical Exam Updated Vital Signs BP (!) 170/90   Pulse 71   Temp 98.9 F (37.2 C) (Oral)   Resp 15   Ht 5\' 8"  (1.727 m)   Wt 65.3 kg   SpO2 98%   BMI 21.90 kg/m   Physical Exam Vitals and nursing Kim reviewed.  Constitutional:      Appearance: She is well-developed.  HENT:     Head: Normocephalic and atraumatic.  Cardiovascular:     Rate and Rhythm: Normal rate.     Pulses: Normal pulses.  Pulmonary:     Effort: Pulmonary effort is normal.  No respiratory distress.     Breath sounds: No wheezing or rales.  Abdominal:     General: Bowel sounds are normal.  Musculoskeletal:     Cervical back: Normal range of motion and neck supple.     Right lower leg: No edema.     Left lower leg: No edema.  Skin:    General: Skin is warm and dry.  Neurological:     Mental Status: She is alert and oriented to person, place, and time.     ED Results / Procedures / Treatments   Labs (all labs ordered are listed, but only abnormal results are displayed) Labs Reviewed  BASIC METABOLIC PANEL - Abnormal; Notable for the following components:      Result Value   Calcium 8.8 (*)    All other components within normal limits  CBC - Abnormal; Notable for the following components:   WBC 3.0 (*)    RBC 3.80 (*)    Hemoglobin 11.2 (*)    HCT 34.2 (*)    All other components within normal limits  SARS CORONAVIRUS 2 (TAT 6-24 HRS)  TROPONIN I (HIGH SENSITIVITY)  TROPONIN I (HIGH SENSITIVITY)    EKG EKG Interpretation  Date/Time:  Wednesday August 19 2019 14:10:35 EDT Ventricular Rate:  76 PR Interval:    QRS Duration: 99 QT Interval:  378 QTC Calculation: 425 R Axis:   82 Text Interpretation: Sinus rhythm Borderline T wave abnormalities 12 Lead; Mason-Likar No old tracing to compare Confirmed by [id number] [phone]) on 08/19/2019 4:33:00 PM   Radiology DG Chest 2 View  Result Date: 08/19/2019 CLINICAL DATA:  Chest pain. EXAM: CHEST - 2 VIEW COMPARISON:  None. FINDINGS: The heart size and mediastinal contours are within normal limits. Both lungs are clear. No pneumothorax or pleural effusion is noted. The visualized skeletal structures are unremarkable. IMPRESSION: No active cardiopulmonary disease. Electronically Signed   By: 08/21/2019 M.D.   On: 08/19/2019 15:03    Procedures Procedures (including critical care time)  Medications Ordered in ED Medications  ketorolac (TORADOL) 30 MG/ML injection 15 mg (has no administration in  time range)    ED Course  I have reviewed the triage vital signs and the nursing notes.  Pertinent labs & imaging results that were available during my care of the patient were reviewed by me and considered in my medical decision making (see chart for details).    MDM Rules/Calculators/A&P HEAR Score: 3                        54 year old comes in a chief complaint of chest pain.  Patient is having nonspecific chest pain over the right side of her chest.  Pain is worse when laying flat.  EKG is not showing any signs  of pericarditis.  Pain is not pleuritic.  She told me that she is taking antiretroviral because of a fingerstick.  It appears on epic that she has been diagnosed with HIV.  I do not think she has infectious process right now.  Chest x-ray is clear.  White count is showing some leukopenia.  She will get outpatient Covid test, as she is not vaccinated with these nonspecific symptoms.  We will give her anti-inflammatory medication.  Strict ER return precautions have been discussed, and patient is agreeing with the plan and is comfortable with the workup done and the recommendations from the ER.  Courtney Kim was evaluated in Emergency Department on 08/19/2019 for the symptoms described in the history of present illness. She was evaluated in the context of the global COVID-19 pandemic, which necessitated consideration that the patient might be at risk for infection with the SARS-CoV-2 virus that causes COVID-19. Institutional protocols and algorithms that pertain to the evaluation of patients at risk for COVID-19 are in a state of rapid change based on information released by regulatory bodies including the CDC and federal and state organizations. These policies and algorithms were followed during the patient's care in the ED.   Final Clinical Impression(s) / ED Diagnoses Final diagnoses:  Precordial chest pain    Rx / DC Orders ED Discharge Orders         Ordered    naproxen  (NAPROSYN) 375 MG tablet  2 times daily     Discontinue  Reprint     08/19/19 2314           Derwood Kaplan, MD 08/19/19 2330

## 2019-08-19 NOTE — ED Triage Notes (Addendum)
C/C chest pain x2 days, central chest pain, relief with antacids and aspirin. No N/V/D, no SOB. States the pain is when she lays down at night, endorses more hoarseness of her voice. She usually gets off at 11pm and makes food before she goes to bed.

## 2019-08-19 NOTE — Discharge Instructions (Signed)

## 2019-08-20 LAB — SARS CORONAVIRUS 2 (TAT 6-24 HRS): SARS Coronavirus 2: NEGATIVE

## 2019-08-31 ENCOUNTER — Ambulatory Visit: Payer: 59

## 2019-08-31 ENCOUNTER — Other Ambulatory Visit: Payer: 59

## 2019-08-31 ENCOUNTER — Other Ambulatory Visit: Payer: Self-pay

## 2019-08-31 DIAGNOSIS — Z21 Asymptomatic human immunodeficiency virus [HIV] infection status: Secondary | ICD-10-CM

## 2019-09-01 LAB — T-HELPER CELL (CD4) - (RCID CLINIC ONLY)
CD4 % Helper T Cell: 16 % — ABNORMAL LOW (ref 33–65)
CD4 T Cell Abs: 158 /uL — ABNORMAL LOW (ref 400–1790)

## 2019-09-03 LAB — HIV-1 RNA QUANT-NO REFLEX-BLD
HIV 1 RNA Quant: 20 copies/mL — ABNORMAL HIGH
HIV-1 RNA Quant, Log: 1.3 Log copies/mL — ABNORMAL HIGH

## 2019-09-14 ENCOUNTER — Encounter: Payer: Self-pay | Admitting: Infectious Diseases

## 2019-09-14 ENCOUNTER — Ambulatory Visit (INDEPENDENT_AMBULATORY_CARE_PROVIDER_SITE_OTHER): Payer: 59 | Admitting: Infectious Diseases

## 2019-09-14 ENCOUNTER — Other Ambulatory Visit: Payer: Self-pay

## 2019-09-14 VITALS — BP 150/92 | HR 87 | Temp 98.2°F | Wt 148.0 lb

## 2019-09-14 DIAGNOSIS — R590 Localized enlarged lymph nodes: Secondary | ICD-10-CM

## 2019-09-14 DIAGNOSIS — Z21 Asymptomatic human immunodeficiency virus [HIV] infection status: Secondary | ICD-10-CM

## 2019-09-14 DIAGNOSIS — Z0000 Encounter for general adult medical examination without abnormal findings: Secondary | ICD-10-CM

## 2019-09-14 MED ORDER — IBUPROFEN 800 MG PO TABS: 800.0000 mg | ORAL_TABLET | Freq: Three times a day (TID) | ORAL | 2 refills | Status: DC | PRN

## 2019-09-14 NOTE — Progress Notes (Signed)
Subjective:    Patient ID: Courtney Kim is a 53 y.o. female     DOB: 11-Feb-1966   MRN: [id number]    Patient Active Problem List   Diagnosis Date Noted  . Healthcare maintenance 09/21/2019  . HIV (human immunodeficiency virus infection) (HCC) 05/05/2019  . Unintentional weight loss 05/05/2019  . Lymphadenopathy of right cervical region 05/05/2019     Chief Complaint  Patient presents with  . Follow-up    B20      HPI Courtney Kim has been doing well since our last visit aside from an ER visit about a month ago for chest pain. She had never experienced anything like that in the past nor since. She described precordial pain - work up negative for cardiopulmonary disease. She was improved with Naprosyn.   She is having some hand pain bilaterally. Notices it upon waking in the AM and later in the evening. No swelling noted. She does not get much relief from 200 mg ibuprofen and asking for Rx for 800 mg ibuprofen. Has been worse in the last few weeks.   She is scheduled to receive COIVD19 vaccine soon.     Review of Systems  Constitutional: Negative for chills, fever and malaise/fatigue.  HENT: Positive for congestion.   Respiratory: Negative for cough, shortness of breath and wheezing.   Cardiovascular: Negative for chest pain, palpitations and leg swelling.  Gastrointestinal: Negative for abdominal pain, nausea and vomiting.  Musculoskeletal: Negative for myalgias.  Skin: Negative for rash.  Neurological: Negative for weakness and headaches.  Psychiatric/Behavioral: The patient is not nervous/anxious.     Outpatient Medications Prior to Visit  Medication Sig Dispense Refill  . aspirin EC 81 MG tablet Take 81 mg by mouth daily. Swallow whole.    . bictegravir-emtricitabine-tenofovir AF (BIKTARVY) 50-200-25 MG TABS tablet Take 1 tablet by mouth daily. Try to take at the same time each day with or without food. 30 tablet 5  . ferrous sulfate 325 (65 FE) MG tablet Take 1 tablet  (325 mg total) by mouth daily with breakfast. Please take with a source of Vitamin C 90 tablet 3  . naproxen (NAPROSYN) 375 MG tablet Take 1 tablet (375 mg total) by mouth 2 (two) times daily. 20 tablet 0  . sulfamethoxazole-trimethoprim (BACTRIM) 400-80 MG tablet Take 1 tablet by mouth daily. 30 tablet 5  . ibuprofen (ADVIL) 200 MG tablet Take 200 mg by mouth every 6 (six) hours as needed.     No facility-administered medications prior to visit.    Past Medical History:  Diagnosis Date  . HIV infection (HCC)   . Hypertension     Family History  Problem Relation Age of Onset  . Hypertension Mother   . Sickle cell anemia Father   . Breast cancer Paternal Grandmother     Social History   Socioeconomic History  . Marital status: Single    Spouse name: Not on file  . Number of children: Not on file  . Years of education: Not on file  . Highest education level: Not on file  Occupational History  . Not on file  Tobacco Use  . Smoking status: Former Smoker    Types: Cigarettes  . Smokeless tobacco: Never Used  . Tobacco comment: quit 08/24/19  Substance and Sexual Activity  . Alcohol use: Never  . Drug use: Never  . Sexual activity: Not Currently    Partners: Male  Other Topics Concern  . Not on file  Social History Narrative  Leave alone   Smoke cigarette occasion   Social Determinants of Health   Financial Resource Strain:   . Difficulty of Paying Living Expenses:   Food Insecurity:   . Worried About Programme researcher, broadcasting/film/video in the Last Year:   . Barista in the Last Year:   Transportation Needs:   . Freight forwarder (Medical):   Marland Kitchen Lack of Transportation (Non-Medical):   Physical Activity:   . Days of Exercise per Week:   . Minutes of Exercise per Session:   Stress:   . Feeling of Stress :   Social Connections:   . Frequency of Communication with Friends and Family:   . Frequency of Social Gatherings with Friends and Family:   . Attends Religious  Services:   . Active Member of Clubs or Organizations:   . Attends Banker Meetings:   Marland Kitchen Marital Status:   Intimate Partner Violence:   . Fear of Current or Ex-Partner:   . Emotionally Abused:   Marland Kitchen Physically Abused:   . Sexually Abused:          Objective:    Today's Vitals   09/14/19 0944  BP: (!) 150/92  Pulse: 87  Temp: 98.2 F (36.8 C)  TempSrc: Oral  Weight: 148 lb (67.1 kg)   Body mass index is 22.5 kg/m.  Physical Exam HENT:     Head: Normocephalic.     Nose: Congestion present.  Eyes:     Extraocular Movements: Extraocular movements intact.     Pupils: Pupils are equal, round, and reactive to light.  Cardiovascular:     Rate and Rhythm: Normal rate and regular rhythm.     Heart sounds: Normal heart sounds.  Pulmonary:     Effort: Pulmonary effort is normal.     Breath sounds: Normal breath sounds.  Abdominal:     General: Abdomen is flat.  Musculoskeletal:        General: Normal range of motion.     Cervical back: Normal range of motion.  Lymphadenopathy:     Cervical: Cervical adenopathy (right cervical node to neck about the same size as last exam ~ 1cm ) present.  Skin:    General: Skin is warm and dry.  Neurological:     Mental Status: She is alert and oriented to person, place, and time.  Psychiatric:        Mood and Affect: Mood normal.        Behavior: Behavior normal.        Thought Content: Thought content normal.        Judgment: Judgment normal.     LABS: Lab Results  Component Value Date   HIV1RNAQUANT 20 (H) 08/31/2019   HIV1RNAQUANT 1,020 (H) 05/18/2019   HIV1RNAQUANT 339,000 (H) 05/05/2019    Lab Results  Component Value Date   CREATININE 0.56 08/19/2019   CREATININE 1.30 (H) 07/23/2019   CREATININE 0.84 05/21/2019    Lab Results  Component Value Date   ALT 8 07/23/2019   AST 16 07/23/2019   ALKPHOS 76 07/23/2019   BILITOT 0.4 07/23/2019    Lab Results  Component Value Date   WBC 3.0 (L)  08/19/2019   HGB 11.2 (L) 08/19/2019   HCT 34.2 (L) 08/19/2019   MCV 90.0 08/19/2019   PLT 239 08/19/2019    No results found for: RPR      Assessment & Plan:   Problem List Items Addressed This Visit  Unprioritized   HIV (human immunodeficiency virus infection) (HCC) - Primary    Viral load has suppressed nicely to only 20 copies on Biktarvy. CD4 has improved to 158 cells. I will have her continue Biktarvy and Bactrim once daily (she has not been refilling the latter, counseled today).  No symptoms of OI on exam aside from stable enlarged cervical lymph node.  Return in about 3 months (around 12/15/2019).      Relevant Orders   HIV-1 RNA quant-no reflex-bld   T-helper cell (CD4)- ([facility] clinic only)   Lymphadenopathy of right cervical region    Still about 1 cm in size. Non tender. About the same size since last encounter.  She will follow up with heme/onc soon to determine further need for work up.  She is feeling better and has no constitutional symptoms described.       Healthcare maintenance    COVID19 vaccine counseling today. Discussed that with her immune system she may have blunted response to the vaccine. While I still recommend she get it, I also want her to continue precautions around others. Discussed that she needs full series of shots to be considered vaccinated (5-6 weeks from her first injection depending on which).   Will discuss updating her pap smear at upcoming visit as well as starting other recommended vaccines.          Rexene Alberts, MSN, NP-C [facility]  [email or web address] Pager: [phone] Office: [phone] [facility] Main Line: [phone]

## 2019-09-14 NOTE — Patient Instructions (Addendum)
For your hands - It sounds like arthritis (joint wear and tear). Please let me know if you perceive any swelling in your hands.   For the pain - I would prefer you start with Tylenol --> 1-2 extra strength once or twice a day.  Diclofenac sodium gel is a great option also - can use with Tylenol if needed. Can apply it up to 4 times a day.  If you have a bad day that the pain is too much I sent in some higher strength Ibuprofen for you to take with food as needed. Would prefer to use as little of this as possible to keep your kidneys in good order.   Your viral load is undetectable - this is perfect! Please continue Biktarvy once a day Please continue to refill your Bactrim and take once a day until our next appointment.    Please return in 3 months with labs prior to your visit.

## 2019-09-16 ENCOUNTER — Other Ambulatory Visit: Payer: Self-pay | Admitting: Internal Medicine

## 2019-09-16 DIAGNOSIS — Z0000 Encounter for general adult medical examination without abnormal findings: Secondary | ICD-10-CM

## 2019-09-21 ENCOUNTER — Encounter: Payer: Self-pay | Admitting: Infectious Diseases

## 2019-09-21 DIAGNOSIS — Z0000 Encounter for general adult medical examination without abnormal findings: Secondary | ICD-10-CM | POA: Insufficient documentation

## 2019-09-21 NOTE — Assessment & Plan Note (Signed)
Viral load has suppressed nicely to only 20 copies on Biktarvy. CD4 has improved to 158 cells. I will have her continue Biktarvy and Bactrim once daily (she has not been refilling the latter, counseled today).  No symptoms of OI on exam aside from stable enlarged cervical lymph node.  Return in about 3 months (around 12/15/2019).

## 2019-09-21 NOTE — Assessment & Plan Note (Addendum)
COVID19 vaccine counseling today. Discussed that with her immune system she may have blunted response to the vaccine. While I still recommend she get it, I also want her to continue precautions around others. Discussed that she needs full series of shots to be considered vaccinated (5-6 weeks from her first injection depending on which).   Will discuss updating her pap smear at upcoming visit as well as starting other recommended vaccines.

## 2019-09-21 NOTE — Assessment & Plan Note (Signed)
Still about 1 cm in size. Non tender. About the same size since last encounter.  She will follow up with heme/onc soon to determine further need for work up.  She is feeling better and has no constitutional symptoms described.

## 2019-10-08 ENCOUNTER — Other Ambulatory Visit: Payer: Self-pay | Admitting: Infectious Diseases

## 2019-10-08 NOTE — Telephone Encounter (Signed)
Please advise on refill.

## 2019-10-14 ENCOUNTER — Ambulatory Visit
Admission: RE | Admit: 2019-10-14 | Discharge: 2019-10-14 | Disposition: A | Discharge status: Home / Self Care | Payer: 59 | Source: Ambulatory Visit | Attending: Internal Medicine | Admitting: Internal Medicine

## 2019-10-14 ENCOUNTER — Other Ambulatory Visit: Payer: Self-pay

## 2019-10-14 DIAGNOSIS — Z0000 Encounter for general adult medical examination without abnormal findings: Secondary | ICD-10-CM

## 2019-10-22 ENCOUNTER — Other Ambulatory Visit: Payer: Self-pay | Admitting: Hematology and Oncology

## 2019-10-22 DIAGNOSIS — D500 Iron deficiency anemia secondary to blood loss (chronic): Secondary | ICD-10-CM

## 2019-10-23 ENCOUNTER — Other Ambulatory Visit: Payer: Self-pay

## 2019-10-23 ENCOUNTER — Inpatient Hospital Stay: Payer: 59 | Attending: Hematology and Oncology | Admitting: Hematology and Oncology

## 2019-10-23 ENCOUNTER — Inpatient Hospital Stay: Payer: 59

## 2019-10-23 DIAGNOSIS — Z21 Asymptomatic human immunodeficiency virus [HIV] infection status: Secondary | ICD-10-CM | POA: Insufficient documentation

## 2019-10-23 DIAGNOSIS — D500 Iron deficiency anemia secondary to blood loss (chronic): Secondary | ICD-10-CM

## 2019-10-23 DIAGNOSIS — D509 Iron deficiency anemia, unspecified: Secondary | ICD-10-CM | POA: Diagnosis not present

## 2019-10-23 LAB — CMP (CANCER CENTER ONLY)
ALT: <6 U/L (ref 0–44)
AST: 12 U/L — ABNORMAL LOW (ref 15–41)
Albumin: 4 g/dL (ref 3.5–5.0)
Alkaline Phosphatase: 89 U/L (ref 38–126)
Anion gap: 5 (ref 5–15)
BUN: 22 mg/dL — ABNORMAL HIGH (ref 6–20)
CO2: 28 mmol/L (ref 22–32)
Calcium: 9.5 mg/dL (ref 8.9–10.3)
Chloride: 104 mmol/L (ref 98–111)
Creatinine: 1.16 mg/dL — ABNORMAL HIGH (ref 0.44–1.00)
GFR, Est AFR Am: >60 mL/min (ref >60)
GFR, Estimated: 53 mL/min — ABNORMAL LOW (ref >60)
Glucose, Bld: 71 mg/dL (ref 70–99)
Potassium: 4.2 mmol/L (ref 3.5–5.1)
Sodium: 137 mmol/L (ref 135–145)
Total Bilirubin: 0.4 mg/dL (ref 0.3–1.2)
Total Protein: 9.7 g/dL — ABNORMAL HIGH (ref 6.5–8.1)

## 2019-10-23 LAB — CBC WITH DIFFERENTIAL (CANCER CENTER ONLY)
Abs Immature Granulocytes: 0.02 10*3/uL (ref 0.00–0.07)
Basophils Absolute: 0 10*3/uL (ref 0.0–0.1)
Basophils Relative: 1 %
Eosinophils Absolute: 0.1 10*3/uL (ref 0.0–0.5)
Eosinophils Relative: 1 %
HCT: 35.4 % — ABNORMAL LOW (ref 36.0–46.0)
Hemoglobin: 11.7 g/dL — ABNORMAL LOW (ref 12.0–15.0)
Immature Granulocytes: 1 %
Lymphocytes Relative: 25 %
Lymphs Abs: 1.1 10*3/uL (ref 0.7–4.0)
MCH: 29 pg (ref 26.0–34.0)
MCHC: 33.1 g/dL (ref 30.0–36.0)
MCV: 87.6 fL (ref 80.0–100.0)
Monocytes Absolute: 0.4 10*3/uL (ref 0.1–1.0)
Monocytes Relative: 10 %
Neutro Abs: 2.7 10*3/uL (ref 1.7–7.7)
Neutrophils Relative %: 62 %
Platelet Count: 246 10*3/uL (ref 150–400)
RBC: 4.04 MIL/uL (ref 3.87–5.11)
RDW: 13.4 % (ref 11.5–15.5)
WBC Count: 4.3 10*3/uL (ref 4.0–10.5)
nRBC: 0 % (ref 0.0–0.2)

## 2019-10-23 LAB — RETIC PANEL
Immature Retic Fract: 6.9 % (ref 2.3–15.9)
RBC.: 4.01 MIL/uL (ref 3.87–5.11)
Retic Count, Absolute: 42.5 10*3/uL (ref 19.0–186.0)
Retic Ct Pct: 1.1 % (ref 0.4–3.1)
Reticulocyte Hemoglobin: 33.7 pg (ref >27.9)

## 2019-10-23 LAB — IRON AND TIBC
Iron: 69 ug/dL (ref 41–142)
Saturation Ratios: 23 % (ref 21–57)
TIBC: 299 ug/dL (ref 236–444)
UIBC: 230 ug/dL (ref 120–384)

## 2019-10-23 LAB — FERRITIN: Ferritin: 86 ng/mL (ref 11–307)

## 2019-10-23 LAB — LACTATE DEHYDROGENASE: LDH: 108 U/L (ref 98–192)

## 2019-10-23 NOTE — Progress Notes (Signed)
Spoke with patient in exam room. Informed patient that she was 3 hours late for her appt and that Dr. Leonides Schanz is not in the office at this time as he is on call.  Advised that I will send a message to scheduling to have her appt re-scheduled. Pt voiced understanding.

## 2019-10-24 NOTE — Progress Notes (Signed)
This encounter was created in error - please disregard.

## 2019-10-27 ENCOUNTER — Encounter: Payer: Self-pay | Admitting: Infectious Diseases

## 2019-10-28 ENCOUNTER — Telehealth: Payer: Self-pay | Admitting: Hematology and Oncology

## 2019-10-28 NOTE — Telephone Encounter (Signed)
Called per 9/3 sch msg - called pt to reschedule / unable to reach pt . Left message for patient to call back to reschedule.

## 2019-11-05 ENCOUNTER — Other Ambulatory Visit: Payer: Self-pay | Admitting: Infectious Diseases

## 2019-11-09 ENCOUNTER — Telehealth: Payer: Self-pay | Admitting: Hematology and Oncology

## 2019-11-09 ENCOUNTER — Telehealth: Payer: Self-pay | Admitting: *Deleted

## 2019-11-09 NOTE — Telephone Encounter (Signed)
Scheduled appointments per 9/20 scheduling message. Called patient, no answer. Left message for patient with appointment date and time.

## 2019-11-09 NOTE — Telephone Encounter (Signed)
-----   Message from Jaci Standard, MD sent at 11/08/2019  2:36 PM EDT ----- Please let Courtney Kim know that the iron pills are working at that her iron levels are rising nicely. Please have her continue this PO iron therapy. We can see her back in another 3 months to assure her findings are stable (please request this appointment be scheduled).  ----- Message ----- From: Leory Plowman, Lab In [place] Sent: 10/23/2019   1:41 PM EDT To: Jaci Standard, MD

## 2019-11-09 NOTE — Telephone Encounter (Signed)
TCT patient regarding recent lab results. Spoke with her and advised that her iron levels were coming up nicely and that she needs to continue to take her oral iron. Advised that we will see her back in 3 months to check her labs. Pt voiced understanding.  Scheduling message sent.

## 2019-12-01 ENCOUNTER — Other Ambulatory Visit: Payer: Self-pay

## 2019-12-01 ENCOUNTER — Other Ambulatory Visit: Payer: 59

## 2019-12-01 DIAGNOSIS — Z21 Asymptomatic human immunodeficiency virus [HIV] infection status: Secondary | ICD-10-CM

## 2019-12-02 LAB — T-HELPER CELL (CD4) - (RCID CLINIC ONLY)
CD4 % Helper T Cell: 20 % — ABNORMAL LOW (ref 33–65)
CD4 T Cell Abs: 204 /uL — ABNORMAL LOW (ref 400–1790)

## 2019-12-03 LAB — HIV-1 RNA QUANT-NO REFLEX-BLD
HIV 1 RNA Quant: 55 Copies/mL — ABNORMAL HIGH
HIV-1 RNA Quant, Log: 1.74 Log cps/mL — ABNORMAL HIGH

## 2019-12-17 ENCOUNTER — Ambulatory Visit (INDEPENDENT_AMBULATORY_CARE_PROVIDER_SITE_OTHER): Payer: 59 | Admitting: Infectious Diseases

## 2019-12-17 ENCOUNTER — Encounter: Payer: Self-pay | Admitting: Infectious Diseases

## 2019-12-17 ENCOUNTER — Other Ambulatory Visit: Payer: Self-pay

## 2019-12-17 DIAGNOSIS — B20 Human immunodeficiency virus [HIV] disease: Secondary | ICD-10-CM | POA: Diagnosis not present

## 2019-12-17 DIAGNOSIS — D649 Anemia, unspecified: Secondary | ICD-10-CM | POA: Diagnosis not present

## 2019-12-17 DIAGNOSIS — R634 Abnormal weight loss: Secondary | ICD-10-CM | POA: Diagnosis not present

## 2019-12-17 NOTE — Patient Instructions (Addendum)
Please continue your Biktarvy every day and Bactrim as well for now.  Your immune system is much better than 8 months ago now.  I would recommend you re-consider the flu shot. Your immune system is stronger now and able to do better to protect you and I suspect you would have less side effects to the flu shot.   Please plan to return in 3 months so we can repeat blood work again to make sure we are good to stop the bactrim  Please have your doctor fax results from your pap smear [phone]

## 2019-12-17 NOTE — Assessment & Plan Note (Signed)
Wt Readings from Last 3 Encounters:  12/17/19 159 lb (72.1 kg)  09/14/19 148 lb (67.1 kg)  08/19/19 144 lb (65.3 kg)   Please she has regained some healthy weight back. BMI in normal range. Good sign of recovery with daily medication use.

## 2019-12-17 NOTE — Assessment & Plan Note (Addendum)
Doing well on Biktarvy once daily. CD4 now > 200. Will conitnue bactrim for 3 more months and then stop. She would like to come back in 3 months to repeat blood work and be sure it is safe to stop that.  She is very interested in [place] - hopeful we can get her started before the new year. She has had > months on Biktarvy and suppressed throughout.  Counseled re: flu shot - she may consider trying it again  COVID vaccine series completed - discussed booster in 13m - with her recently completing it I am hopeful she had an adequate response given her CD4 improvement.  Pap smear results requested to be faxed to put on her file.

## 2019-12-17 NOTE — Assessment & Plan Note (Signed)
Good recovery with PO iron and control over HIV. Leucopenia has resolved and now she is nearly back to normal hgb. She is correctly separating biktarvy and iron to avoid chelation effect.

## 2019-12-17 NOTE — Progress Notes (Signed)
Subjective:    Patient ID: Courtney Kim is a 54 y.o. female     DOB: Dec 21, 1965   MRN: [id number]    Patient Active Problem List   Diagnosis Date Noted  . Anemia 12/17/2019  . Healthcare maintenance 09/21/2019  . HIV (human immunodeficiency virus infection) (HCC) 05/05/2019  . Unintentional weight loss 05/05/2019  . Lymphadenopathy of right cervical region 05/05/2019     Chief Complaint  Patient presents with  . Follow-up    No questions or concerns      HPI Courtney Kim is doing well. States she is thankful she regained some weight. More energy and no longer cold like she was. Taking biktarvy every evening without any concern for missed doses or side effects. Takes her iron supplements in the morning well apart from biktarvy.  Would like to get a timeline on injectable Cabenuva option - she is very interested in something that is not a daily reminder for her for multiple reasons.   COVID vaccine completed with pfizer series about a month ago. Hesitant for flu shot with h/o illness the last time she received - had 3d of chills and fatigue. No trouble with side effects from COVID vaccine previously.   Has a annul visit coming up with her PCP tomorrow - unclear if they plan to do pap smear or not. Unclear when she had one last.    Review of Systems  Constitutional: Negative for chills and fever.  HENT: Negative for tinnitus.   Eyes: Negative for blurred vision and photophobia.  Respiratory: Negative for cough and sputum production.   Cardiovascular: Negative for chest pain.  Gastrointestinal: Negative for diarrhea, nausea and vomiting.  Genitourinary: Negative for dysuria.  Skin: Negative for rash.  Neurological: Negative for headaches.    Outpatient Medications Prior to Visit  Medication Sig Dispense Refill  . bictegravir-emtricitabine-tenofovir AF (BIKTARVY) 50-200-25 MG TABS tablet Take 1 tablet by mouth daily. Try to take at the same time each day with or without food. 30  tablet 5  . ferrous sulfate 325 (65 FE) MG tablet Take 1 tablet (325 mg total) by mouth daily with breakfast. Please take with a source of Vitamin C 90 tablet 3  . ibuprofen (ADVIL) 800 MG tablet TAKE 1 TABLET(800 MG) BY MOUTH EVERY 8 HOURS AS NEEDED 30 tablet 2  . sulfamethoxazole-trimethoprim (BACTRIM) 400-80 MG tablet Take 1 tablet by mouth daily. 30 tablet 5  . aspirin EC 81 MG tablet Take 81 mg by mouth daily. Swallow whole. (Patient not taking: Reported on 12/17/2019)    . naproxen (NAPROSYN) 375 MG tablet Take 1 tablet (375 mg total) by mouth 2 (two) times daily. (Patient not taking: Reported on 12/17/2019) 20 tablet 0   No facility-administered medications prior to visit.    Past Medical History:  Diagnosis Date  . HIV infection (HCC)   . Hypertension     Family History  Problem Relation Age of Onset  . Hypertension Mother   . Sickle cell anemia Father   . Breast cancer Paternal Grandmother     Social History   Socioeconomic History  . Marital status: Single    Spouse name: Not on file  . Number of children: Not on file  . Years of education: Not on file  . Highest education level: Not on file  Occupational History  . Not on file  Tobacco Use  . Smoking status: Former Smoker    Types: Cigarettes  . Smokeless tobacco: Never Used  . Tobacco  comment: quit 08/24/19  Substance and Sexual Activity  . Alcohol use: Never  . Drug use: Never  . Sexual activity: Not Currently    Partners: Male  Other Topics Concern  . Not on file  Social History Narrative   Leave alone   Smoke cigarette occasion   Social Determinants of Health   Financial Resource Strain:   . Difficulty of Paying Living Expenses: Not on file  Food Insecurity:   . Worried About Programme researcher, broadcasting/film/video in the Last Year: Not on file  . Ran Out of Food in the Last Year: Not on file  Transportation Needs:   . Lack of Transportation (Medical): Not on file  . Lack of Transportation (Non-Medical): Not on file   Physical Activity:   . Days of Exercise per Week: Not on file  . Minutes of Exercise per Session: Not on file  Stress:   . Feeling of Stress : Not on file  Social Connections:   . Frequency of Communication with Friends and Family: Not on file  . Frequency of Social Gatherings with Friends and Family: Not on file  . Attends Religious Services: Not on file  . Active Member of Clubs or Organizations: Not on file  . Attends Banker Meetings: Not on file  . Marital Status: Not on file  Intimate Partner Violence:   . Fear of Current or Ex-Partner: Not on file  . Emotionally Abused: Not on file  . Physically Abused: Not on file  . Sexually Abused: Not on file         Objective:    Today's Vitals   12/17/19 1127 12/17/19 1132  BP: 133/81   Pulse: 86   SpO2: 98%   Weight: 159 lb (72.1 kg)   PainSc: 0-No pain 0-No pain   Body mass index is 24.18 kg/m.  Physical Exam Constitutional:      Appearance: Normal appearance. She is not ill-appearing.  HENT:     Mouth/Throat:     Mouth: Mucous membranes are moist.     Pharynx: Oropharynx is clear.  Eyes:     General: No scleral icterus. Pulmonary:     Effort: Pulmonary effort is normal.  Neurological:     Mental Status: She is oriented to person, place, and time.  Psychiatric:        Mood and Affect: Mood normal.        Thought Content: Thought content normal.     LABS: Lab Results  Component Value Date   HIV1RNAQUANT 55 (H) 12/01/2019   HIV1RNAQUANT 20 (H) 08/31/2019   HIV1RNAQUANT 1,020 (H) 05/18/2019    Lab Results  Component Value Date   CREATININE 1.16 (H) 10/23/2019   CREATININE 0.56 08/19/2019   CREATININE 1.30 (H) 07/23/2019    Lab Results  Component Value Date   ALT <6 10/23/2019   AST 12 (L) 10/23/2019   ALKPHOS 89 10/23/2019   BILITOT 0.4 10/23/2019    Lab Results  Component Value Date   WBC 4.3 10/23/2019   HGB 11.7 (L) 10/23/2019   HCT 35.4 (L) 10/23/2019   MCV 87.6  10/23/2019   PLT 246 10/23/2019    No results found for: RPR      Assessment & Plan:   Problem List Items Addressed This Visit      Unprioritized   Unintentional weight loss    Wt Readings from Last 3 Encounters:  12/17/19 159 lb (72.1 kg)  09/14/19 148 lb (67.1 kg)  08/19/19  144 lb (65.3 kg)   Please she has regained some healthy weight back. BMI in normal range. Good sign of recovery with daily medication use.       HIV (human immunodeficiency virus infection) (HCC)    Doing well on Biktarvy once daily. CD4 now > 200. Will conitnue bactrim for 3 more months and then stop. She would like to come back in 3 months to repeat blood work and be sure it is safe to stop that.  She is very interested in [place] - hopeful we can get her started before the new year. She has had > months on Biktarvy and suppressed throughout.  Counseled re: flu shot - she may consider trying it again  COVID vaccine series completed - discussed booster in 59m - with her recently completing it I am hopeful she had an adequate response given her CD4 improvement.  Pap smear results requested to be faxed to put on her file.       Anemia    Good recovery with PO iron and control over HIV. Leucopenia has resolved and now she is nearly back to normal hgb. She is correctly separating biktarvy and iron to avoid chelation effect.          Rexene Alberts, MSN, NP-C [facility] for Infectious Disease [facility]  [email or web address] Pager: [phone] Office: [phone] [facility] Main Line: [phone]

## 2020-02-04 ENCOUNTER — Other Ambulatory Visit: Payer: Self-pay | Admitting: Pharmacist

## 2020-02-04 DIAGNOSIS — B20 Human immunodeficiency virus [HIV] disease: Secondary | ICD-10-CM

## 2020-02-08 ENCOUNTER — Inpatient Hospital Stay: Payer: 59 | Admitting: Hematology and Oncology

## 2020-02-08 ENCOUNTER — Other Ambulatory Visit: Payer: Self-pay | Admitting: Hematology and Oncology

## 2020-02-08 ENCOUNTER — Inpatient Hospital Stay: Payer: 59 | Attending: Hematology and Oncology

## 2020-02-08 DIAGNOSIS — D649 Anemia, unspecified: Secondary | ICD-10-CM

## 2020-03-05 ENCOUNTER — Other Ambulatory Visit: Payer: Self-pay | Admitting: Infectious Diseases

## 2020-04-02 ENCOUNTER — Other Ambulatory Visit: Payer: Self-pay | Admitting: Pharmacist

## 2020-04-02 DIAGNOSIS — B20 Human immunodeficiency virus [HIV] disease: Secondary | ICD-10-CM

## 2020-04-04 NOTE — Telephone Encounter (Signed)
Pt has appt on 2/17. Holding on appt

## 2020-04-07 ENCOUNTER — Other Ambulatory Visit: Payer: Self-pay

## 2020-04-07 ENCOUNTER — Ambulatory Visit (INDEPENDENT_AMBULATORY_CARE_PROVIDER_SITE_OTHER): Payer: 59 | Admitting: Infectious Diseases

## 2020-04-07 ENCOUNTER — Telehealth: Payer: Self-pay

## 2020-04-07 ENCOUNTER — Encounter: Payer: Self-pay | Admitting: Infectious Diseases

## 2020-04-07 VITALS — BP 146/90 | HR 80 | Temp 97.8°F | Wt 171.0 lb

## 2020-04-07 DIAGNOSIS — R590 Localized enlarged lymph nodes: Secondary | ICD-10-CM | POA: Diagnosis not present

## 2020-04-07 DIAGNOSIS — R634 Abnormal weight loss: Secondary | ICD-10-CM | POA: Diagnosis not present

## 2020-04-07 DIAGNOSIS — B20 Human immunodeficiency virus [HIV] disease: Secondary | ICD-10-CM | POA: Diagnosis not present

## 2020-04-07 DIAGNOSIS — Z23 Encounter for immunization: Secondary | ICD-10-CM | POA: Diagnosis not present

## 2020-04-07 DIAGNOSIS — Z21 Asymptomatic human immunodeficiency virus [HIV] infection status: Secondary | ICD-10-CM

## 2020-04-07 NOTE — Patient Instructions (Addendum)
Always nice to see you!  Please continue your Biktarvy for now. Will see if we can get you started on the Cabenuva injections.   Prior to starting the CABENUVA treatment we need to start you on a pill-version first for 28 days.  This is to make sure that you don't have any side effects that would be challenging for [place] to use this for you since the shots last in the body longer than the pills.   The new pills are called [place] and Edurant. These are taken both together once a day with a meal (needs to be a good size meal so the medication can absorb well).   ALWAYS take the medication with a full meal.  Please avoid any proton pump inhibitors (medications that reduce stomach acid like Prilosec or Protonix), TUMS or Rolaids.    Please stop by the lab on your way out.   Will see you back in 3 months for a 30 minute appointment to get your pap smear done. Will see you sooner if we can get you started on the cabenuva before this.   For your allergies - start taking a Claritin or Zyrtec once daily. If you have stuffy nose can start taking Flonase nasal spray daily.

## 2020-04-07 NOTE — Telephone Encounter (Signed)
[facility] Patient Advocate Encounter   Received notification from [facility] that prior authorization for Courtney Kim is required.   PA submitted on 04/07/20 Key [id number] Status is pending    [facility] will continue to follow.   Clearance Coots, CPhT Specialty Pharmacy Patient [facility] for Infectious Disease Phone: [phone] Fax:  [phone]

## 2020-04-07 NOTE — Assessment & Plan Note (Addendum)
She is doing well on Biktarvy once a day with good clinical response with low / undetectable viral loads and reconstitution of CD4. Will check pertinent labs today for therapeutic monitoring. Proceed with routine vaccines for [facility] including Prevnar today. Declined flu shot today as she always has side effects that keep her from working. May consider when more convenient to her work day.  Can stop Bactrim given CD4 was > 200 7m ago.  Will submit PA for Cabenuva - not clear that her insurance has this on formulary yet. Will re-try in 4-6 months if denied.  Return in about 3 months (around 07/05/2020).

## 2020-04-07 NOTE — Assessment & Plan Note (Signed)
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 Continues to follow with oncology as well. If this worsens or she develops any constitutional symptoms would check CT and plan to biopsy to rule out lymphoma for her given chronicity and in the setting of severe immunodeficiency history related to advanced HIV, +AIDS.

## 2020-04-07 NOTE — Progress Notes (Signed)
Subjective:    Patient ID: Courtney Kim is a 55 y.o. female     DOB: [birth year]   MRN: [id number]    CC: HIV routine follow up care.  Neck lump is much smaller. Feels well and regained some weight.     HPI: Courtney Kim is doing well. Taking her biktarvy everyday without many missed doses. No concerns for side effects. She is very interested in [place] injections as soon as she would be able to take them. She hates taking pills everyday and feels that she would do very well with these injections for her health and mental health overall.  Denies any fevers, chills or rashes. Feels that the lumps on the right side of her neck are much improved and barely noticeable now.   Stopped smoking cigarettes about 4 months ago. Has a bit of a cough and stuffy nose lately with allergies coming back in. Has good results with Claritin one daily usually.     Review of Systems  Constitutional: Negative for chills and fever.  HENT: Positive for congestion. Negative for sore throat and tinnitus.   Eyes: Negative for blurred vision and photophobia.  Respiratory: Positive for cough. Negative for sputum production.   Cardiovascular: Negative for chest pain.  Gastrointestinal: Negative for diarrhea, nausea and vomiting.  Genitourinary: Negative for dysuria.  Skin: Negative for rash.  Neurological: Negative for headaches.  Psychiatric/Behavioral: Negative for depression. The patient is not nervous/anxious and does not have insomnia.     Outpatient Medications Prior to Visit  Medication Sig Dispense Refill  . aspirin EC 81 MG tablet Take 81 mg by mouth daily. Swallow whole.    Marland Kitchen BIKTARVY 50-200-25 MG TABS tablet TAKE 1 TABLET BY MOUTH DAILY, AT THE SAME TIME EACH DAY WITH OR WITHOUT FOOD. 30 tablet 5  . ferrous sulfate 325 (65 FE) MG tablet Take 1 tablet (325 mg total) by mouth daily with breakfast. Please take with a source of Vitamin C 90 tablet 3  . ibuprofen (ADVIL) 800 MG tablet TAKE 1 TABLET(800  MG) BY MOUTH EVERY 8 HOURS AS NEEDED 30 tablet 2  . naproxen (NAPROSYN) 375 MG tablet Take 1 tablet (375 mg total) by mouth 2 (two) times daily. 20 tablet 0  . sulfamethoxazole-trimethoprim (BACTRIM) 400-80 MG tablet Take 1 tablet by mouth daily. 30 tablet 5   No facility-administered medications prior to visit.    Past Medical History:  Diagnosis Date  . HIV infection (HCC)   . Hypertension     Social History   Socioeconomic History  . Marital status: Single    Spouse name: Not on file  . Number of children: Not on file  . Years of education: Not on file  . Highest education level: Not on file  Occupational History  . Not on file  Tobacco Use  . Smoking status: Former Smoker    Types: Cigarettes  . Smokeless tobacco: Never Used  . Tobacco comment: quit 08/24/19  Substance and Sexual Activity  . Alcohol use: Never  . Drug use: Never  . Sexual activity: Not Currently    Partners: Male    Comment: declined condoms 03/2020  Other Topics Concern  . Not on file  Social History Narrative   Leave alone   Smoke cigarette occasion   Social Determinants of Health   Financial Resource Strain: Not on file  Food Insecurity: Not on file  Transportation Needs: Not on file  Physical Activity: Not on file  Stress: Not on file  Social Connections: Not on file  Intimate Partner Violence: Not on file        Objective:    Today's Vitals   04/07/20 1134  BP: (!) 146/90  Pulse: 80  Temp: 97.8 F (36.6 C)  TempSrc: Oral  Weight: 171 lb (77.6 kg)   Body mass index is 26 kg/m.  Physical Exam Constitutional:      Appearance: Normal appearance. She is not ill-appearing.  HENT:     Mouth/Throat:     Mouth: Mucous membranes are moist.     Pharynx: Oropharynx is clear.  Eyes:     General: No scleral icterus. Neck:      Comments: Improved lymphadenopathy to the right cervical chain. Still palpable but ~0.5 cm now Pulmonary:     Effort: Pulmonary effort is normal.   Neurological:     Mental Status: She is oriented to person, place, and time.  Psychiatric:        Mood and Affect: Mood normal.        Thought Content: Thought content normal.     LABS: Lab Results  Component Value Date   HIV1RNAQUANT 55 (H) 12/01/2019   HIV1RNAQUANT 20 (H) 08/31/2019   HIV1RNAQUANT 1,020 (H) 05/18/2019    Lab Results  Component Value Date   CREATININE 1.16 (H) 10/23/2019   CREATININE 0.56 08/19/2019   CREATININE 1.30 (H) 07/23/2019    Lab Results  Component Value Date   ALT <6 10/23/2019   AST 12 (L) 10/23/2019   ALKPHOS 89 10/23/2019   BILITOT 0.4 10/23/2019    Lab Results  Component Value Date   WBC 4.3 10/23/2019   HGB 11.7 (L) 10/23/2019   HCT 35.4 (L) 10/23/2019   MCV 87.6 10/23/2019   PLT 246 10/23/2019    No results found for: RPR      Assessment & Plan:   Problem List Items Addressed This Visit      Unprioritized   RESOLVED: Unintentional weight loss    Resolved over the last 1 year on treatment.       Lymphadenopathy of right cervical region    Improved - still with residual non-tender, ~0.5 cm in anterior neck. Continues to follow with oncology as well. If this worsens or she develops any constitutional symptoms would check CT and plan to biopsy to rule out lymphoma for her given chronicity and in the setting of severe immunodeficiency history related to advanced HIV, +AIDS.       HIV (human immunodeficiency virus infection) (HCC)    She is doing well on Biktarvy once a day with good clinical response with low / undetectable viral loads and reconstitution of CD4. Will check pertinent labs today for therapeutic monitoring. Proceed with routine vaccines for PLWH including Prevnar today. Declined flu shot today as she always has side effects that keep her from working. May consider when more convenient to her work day.  Can stop Bactrim given CD4 was > 200 64m ago.  Will submit PA for Cabenuva - not clear that her insurance has  this on formulary yet. Will re-try in 4-6 months if denied.  Return in about 3 months (around 07/05/2020).        Other Visit Diagnoses    Asymptomatic HIV infection (HCC)    -  Primary   Relevant Orders   HIV-1 RNA quant-no reflex-bld   T-helper cell (CD4)- ([facility] clinic only)   Need for vaccination with 13-polyvalent pneumococcal conjugate vaccine       Relevant  Orders   Pneumococcal conjugate vaccine 13-valent IM (Completed)      Rexene Alberts, MSN, NP-C [facility]  [email or web address] Pager: [phone] Office: [phone] [facility] Main Line: [phone]

## 2020-04-07 NOTE — Assessment & Plan Note (Signed)
Resolved over the last 1 year on treatment.

## 2020-04-08 LAB — T-HELPER CELL (CD4) - (RCID CLINIC ONLY)
CD4 % Helper T Cell: 25 % — ABNORMAL LOW (ref 33–65)
CD4 T Cell Abs: 244 /uL — ABNORMAL LOW (ref 400–1790)

## 2020-04-10 LAB — HIV-1 RNA QUANT-NO REFLEX-BLD
HIV 1 RNA Quant: <20 Copies/mL
HIV-1 RNA Quant, Log: <1.3 Log cps/mL

## 2020-04-11 ENCOUNTER — Telehealth: Payer: Self-pay

## 2020-04-11 NOTE — Telephone Encounter (Signed)
Patient called and made aware.  Courtney Kim

## 2020-04-11 NOTE — Telephone Encounter (Signed)
-----   Message from Blanchard Kelch, NP sent at 04/11/2020 11:21 AM EST ----- Please call Courtney Kim to let her know that her viral load is undetectable and her immune system continues to improve slowly. She does not need to take the Bactrim antibiotic anymore.

## 2020-04-11 NOTE — Progress Notes (Signed)
Please call Rhya to let her know that her viral load is undetectable and her immune system continues to improve slowly. She does not need to take the Bactrim antibiotic anymore.

## 2020-06-27 ENCOUNTER — Telehealth: Payer: Self-pay

## 2020-06-27 DIAGNOSIS — U071 COVID-19: Secondary | ICD-10-CM

## 2020-06-27 MED ORDER — NIRMATRELVIR/RITONAVIR (PAXLOVID)TABLET: 2.0000 | ORAL_TABLET | Freq: Two times a day (BID) | ORAL | 0 refills | Status: AC

## 2020-06-27 NOTE — Telephone Encounter (Signed)
Called to speak with Neoma Laming. Her symptoms started Friday morning with back pain and URI symptoms. Saturday she felt pretty poorly.   She is a candidate for paxlovid - reduced dose with last eGFR 53. No drug interactions noted. Will send in rx for her to pick up through drive through.   Counseling discussed re: side effects and duration of therapy (2 pills taken twice a day for 5 days). Isolation precautions discussed.    Janene Madeira, MSN, NP-C [facility] [email or web address] Pager: [phone] Office: [phone] [place]: [phone]

## 2020-06-27 NOTE — Telephone Encounter (Signed)
Patient called office today to follow up on pos covid test. States on Friday she tested positive for covid at her job.  Patient is only experiencing stuff nose. No difficulty breathing. Patient would like for provider to know. Left voicemail for PCP.  Will call if she feels like she is feeling worse. Will manage symptoms with OTC medication. Juanita Laster, RMA

## 2020-06-27 NOTE — Addendum Note (Signed)
Addended by: Blanchard Kelch on: 06/27/2020 03:45 PM   Modules accepted: Orders

## 2020-07-19 ENCOUNTER — Ambulatory Visit: Payer: 59 | Admitting: Infectious Diseases

## 2020-07-22 ENCOUNTER — Telehealth: Payer: Self-pay

## 2020-07-22 ENCOUNTER — Other Ambulatory Visit: Payer: Self-pay

## 2020-07-22 ENCOUNTER — Ambulatory Visit (INDEPENDENT_AMBULATORY_CARE_PROVIDER_SITE_OTHER): Payer: 59 | Admitting: Infectious Diseases

## 2020-07-22 ENCOUNTER — Encounter: Payer: Self-pay | Admitting: Infectious Diseases

## 2020-07-22 ENCOUNTER — Other Ambulatory Visit (HOSPITAL_COMMUNITY): Payer: Self-pay

## 2020-07-22 VITALS — BP 161/93 | HR 79 | Temp 98.0°F | Ht 68.0 in | Wt 176.0 lb

## 2020-07-22 DIAGNOSIS — Z23 Encounter for immunization: Secondary | ICD-10-CM

## 2020-07-22 DIAGNOSIS — R030 Elevated blood-pressure reading, without diagnosis of hypertension: Secondary | ICD-10-CM

## 2020-07-22 DIAGNOSIS — B20 Human immunodeficiency virus [HIV] disease: Secondary | ICD-10-CM

## 2020-07-22 DIAGNOSIS — I10 Essential (primary) hypertension: Secondary | ICD-10-CM | POA: Diagnosis not present

## 2020-07-22 DIAGNOSIS — R42 Dizziness and giddiness: Secondary | ICD-10-CM

## 2020-07-22 DIAGNOSIS — Z21 Asymptomatic human immunodeficiency virus [HIV] infection status: Secondary | ICD-10-CM

## 2020-07-22 DIAGNOSIS — R590 Localized enlarged lymph nodes: Secondary | ICD-10-CM

## 2020-07-22 DIAGNOSIS — J302 Other seasonal allergic rhinitis: Secondary | ICD-10-CM

## 2020-07-22 MED ORDER — MECLIZINE HCL 25 MG PO TABS: 25.0000 mg | ORAL_TABLET | Freq: Three times a day (TID) | ORAL | 0 refills | Status: DC | PRN

## 2020-07-22 MED ORDER — BLOOD PRESSURE CUFF MISC: 1.0000 [IU] | Freq: Once | 0 refills | Status: AC

## 2020-07-22 NOTE — Telephone Encounter (Signed)
[facility] Patient Advocate Encounter  I have called the Insurance company to see if patient medical coverage pays for the J-Code [phone]).  Medication Courtney Kim) is not covered under her medical plan . Reference # [id number] (Excluded)  I also requested another PA from patient insurance under the pharmacy coverage ([id number])  Clearance Coots, CPhT Specialty Pharmacy Patient [facility] Phone: [phone] Fax:  [phone]

## 2020-07-22 NOTE — Patient Instructions (Addendum)
Instead of claritin try zyrtec or Xyzal.   Avoid allergy medication with "-D" on the end - this makes your blood pressure way higher.   For the nasal congestion try Flonase 1-2 times a day. This will take about a week to be super helpful   Will send in a prescription for antivert for you to help with the dizziness and congestion for allergies.   Will see you back in 4-6 months.   [facility] is the site I talked about today.

## 2020-07-22 NOTE — Progress Notes (Signed)
Subjective:    Patient ID: Courtney Kim is a 55 y.o. female     DOB: 10-16-1965   MRN: [id number]    CC: HIV routine follow up care.  Allergies are a challenge lately.     HPI: Courtney Kim is doing well. Taking her biktarvy everyday without many missed doses. No concerns for side effects. Unable to get cabenuva injections d/t insurance denial last time we checked but she is still very interested in this treatment when she can get approved. She is starting to consider dating again.  Has been talking to 1 female partner, having some stress about how she should approach discussion regarding her health condition.  Allergies are troubling her -has nasal congestion, nasally voice, drainage, cough and hoarseness.  She is using some intermittent doses of Claritin to treat this.  She has noticed some intermittent dizziness during this timeframe. Occasionally feels that the room is spinning a bit.   Neck lump is nearly gone. Happy with current weight and level of health/energy.     Review of Systems  Constitutional: Negative for chills and fever.  HENT: Positive for congestion. Negative for sore throat and tinnitus.   Eyes: Negative for blurred vision and photophobia.  Respiratory: Positive for cough. Negative for sputum production.   Cardiovascular: Negative for chest pain.  Gastrointestinal: Negative for diarrhea, nausea and vomiting.  Genitourinary: Negative for dysuria.  Skin: Negative for rash.  Neurological: Positive for dizziness. Negative for headaches.  Psychiatric/Behavioral: Negative for depression. The patient is not nervous/anxious and does not have insomnia.     Outpatient Medications Prior to Visit  Medication Sig Dispense Refill  . BIKTARVY 50-200-25 MG TABS tablet TAKE 1 TABLET BY MOUTH DAILY, AT THE SAME TIME EACH DAY WITH OR WITHOUT FOOD. 30 tablet 5  . ferrous sulfate 325 (65 FE) MG tablet Take 1 tablet (325 mg total) by mouth daily with breakfast. Please take with a source  of Vitamin C 90 tablet 3  . ibuprofen (ADVIL) 800 MG tablet TAKE 1 TABLET(800 MG) BY MOUTH EVERY 8 HOURS AS NEEDED 30 tablet 2  . aspirin EC 81 MG tablet Take 81 mg by mouth daily. Swallow whole. (Patient not taking: Reported on 07/22/2020)    . naproxen (NAPROSYN) 375 MG tablet Take 1 tablet (375 mg total) by mouth 2 (two) times daily. (Patient not taking: Reported on 07/22/2020) 20 tablet 0  . sulfamethoxazole-trimethoprim (BACTRIM) 400-80 MG tablet Take 1 tablet by mouth daily. (Patient not taking: Reported on 07/22/2020) 30 tablet 5   No facility-administered medications prior to visit.    Past Medical History:  Diagnosis Date  . HIV infection (HCC)   . Hypertension     Social History   Socioeconomic History  . Marital status: Single    Spouse name: Not on file  . Number of children: Not on file  . Years of education: Not on file  . Highest education level: Not on file  Occupational History  . Not on file  Tobacco Use  . Smoking status: Former Smoker    Types: Cigarettes, E-cigarettes  . Smokeless tobacco: Never Used  . Tobacco comment: quit 08/24/19  Substance and Sexual Activity  . Alcohol use: Never  . Drug use: Never  . Sexual activity: Not Currently    Partners: Male    Comment: declined condoms  Other Topics Concern  . Not on file  Social History Narrative   Leave alone   Smoke cigarette occasion   Social Determinants of Health  Financial Resource Strain: Not on file  Food Insecurity: Not on file  Transportation Needs: Not on file  Physical Activity: Not on file  Stress: Not on file  Social Connections: Not on file  Intimate Partner Violence: Not on file        Objective:    Today's Vitals   07/22/20 0935  BP: (!) 161/93  Pulse: 79  Temp: 98 F (36.7 C)  TempSrc: Oral  SpO2: 99%  Weight: 176 lb (79.8 kg)  Height: 5\' 8"  (1.727 m)  PainSc: 0-No pain   Body mass index is 26.76 kg/m.  Physical Exam Vitals reviewed.  Constitutional:       Appearance: Normal appearance. She is not ill-appearing.  HENT:     Ears:     Comments: R Middle ear effusion noted without signs of infection.     Nose: Congestion present.     Mouth/Throat:     Mouth: Mucous membranes are moist.     Pharynx: Oropharynx is clear.  Eyes:     General: No scleral icterus. Pulmonary:     Effort: Pulmonary effort is normal.  Neurological:     Mental Status: She is oriented to person, place, and time.  Psychiatric:        Mood and Affect: Mood normal.        Thought Content: Thought content normal.     LABS: Lab Results  Component Value Date   HIV1RNAQUANT Not Detected 07/22/2020   HIV1RNAQUANT <20 04/07/2020   HIV1RNAQUANT 55 (H) 12/01/2019    Lab Results  Component Value Date   CREATININE 0.64 07/22/2020   CREATININE 1.16 (H) 10/23/2019   CREATININE 0.56 08/19/2019    Lab Results  Component Value Date   ALT 7 07/22/2020   AST 12 07/22/2020   ALKPHOS 89 10/23/2019   BILITOT 0.7 07/22/2020    Lab Results  Component Value Date   WBC 3.5 (L) 07/22/2020   HGB 12.3 07/22/2020   HCT 36.6 07/22/2020   MCV 86.7 07/22/2020   PLT 258 07/22/2020    No results found for: RPR      Assessment & Plan:   Problem List Items Addressed This Visit      Unprioritized   Seasonal allergies    We will give her some Antivert to take up to 3 times daily for allergies and dizziness.  Flonase for nasal congestion.  Avoid any D containing products that would increase her blood pressure further.  Topical saline rinses.  Keeping windows down at night, changing air filters, vacuum carpets.       Lymphadenopathy of right cervical region    Nearly resolved.  No constitutional symptoms.  We will continue to follow.  She has not seen hematology in a few months.  Discussed that should this recur or get larger I would prefer to have it biopsied.      HIV (human immunodeficiency virus infection) (HCC)    Doing well on Biktarvy once daily.  We will check  with her insurance again to see if we can switch her regimen to parenteral treatment.  We talked about some resources for discussing her medical problems with a new partner.  Pros and cons of early discussion versus waiting.  I also gave her some resources regarding specifically women's support and the role of HIV available online.   I will give her Pneumovax and DTaP today. Return in about 4 months (around 11/21/2020).       Elevated blood pressure reading  She has in the past required blood pressure medications.  Some of this is likely due to anxiety given today's visit.  I encouraged her to check her blood pressures at home and get more information and discuss with her PCP.  Recommended to avoid any over-the-counter decongestions as this can increase her blood pressure further.       Other Visit Diagnoses    Asymptomatic HIV infection (HCC)    -  Primary   Relevant Orders   HIV-1 RNA quant-no reflex-bld (Completed)   T-helper cells (CD4) count (Completed)   Tdap vaccine greater than or equal to 7yo IM (Completed)   Pneumococcal polysaccharide vaccine 23-valent greater than or equal to 2yo subcutaneous/IM (Completed)   Dizziness       Relevant Medications   meclizine (ANTIVERT) 25 MG tablet   Hypertension, unspecified type       Relevant Orders   COMPLETE METABOLIC PANEL WITH GFR (Completed)   CBC with Differential/Platelet (Completed)   Need for pneumococcal vaccination       Relevant Orders   Pneumococcal polysaccharide vaccine 23-valent greater than or equal to 2yo subcutaneous/IM (Completed)   Need for Tdap vaccination       Relevant Orders   Tdap vaccine greater than or equal to 7yo IM (Completed)      Rexene Alberts, MSN, NP-C [facility]  [email or web address] Pager: [phone] Office: [phone] [facility] Main Line: [phone]

## 2020-07-25 LAB — T-HELPER CELLS (CD4) COUNT (NOT AT ARMC)
Absolute CD4: 279 cells/uL — ABNORMAL LOW (ref 490–1740)
CD4 T Helper %: 28 % — ABNORMAL LOW (ref 30–61)
Total lymphocyte count: 1013 cells/uL (ref 850–3900)

## 2020-07-25 LAB — CBC WITH DIFFERENTIAL/PLATELET
Absolute Monocytes: 333 cells/uL (ref 200–950)
Basophils Absolute: 32 cells/uL (ref 0–200)
Basophils Relative: 0.9 %
Eosinophils Absolute: 91 cells/uL (ref 15–500)
Eosinophils Relative: 2.6 %
HCT: 36.6 % (ref 35.0–45.0)
Hemoglobin: 12.3 g/dL (ref 11.7–15.5)
Lymphs Abs: 1054 cells/uL (ref 850–3900)
MCH: 29.1 pg (ref 27.0–33.0)
MCHC: 33.6 g/dL (ref 32.0–36.0)
MCV: 86.7 fL (ref 80.0–100.0)
MPV: 10.9 fL (ref 7.5–12.5)
Monocytes Relative: 9.5 %
Neutro Abs: 1992 cells/uL (ref 1500–7800)
Neutrophils Relative %: 56.9 %
Platelets: 258 10*3/uL (ref 140–400)
RBC: 4.22 10*6/uL (ref 3.80–5.10)
RDW: 12.9 % (ref 11.0–15.0)
Total Lymphocyte: 30.1 %
WBC: 3.5 10*3/uL — ABNORMAL LOW (ref 3.8–10.8)

## 2020-07-25 LAB — COMPLETE METABOLIC PANEL WITH GFR
AG Ratio: 1 (calc) (ref 1.0–2.5)
ALT: 7 U/L (ref 6–29)
AST: 12 U/L (ref 10–35)
Albumin: 4.4 g/dL (ref 3.6–5.1)
Alkaline phosphatase (APISO): 74 U/L (ref 37–153)
BUN: 10 mg/dL (ref 7–25)
CO2: 27 mmol/L (ref 20–32)
Calcium: 9.5 mg/dL (ref 8.6–10.4)
Chloride: 106 mmol/L (ref 98–110)
Creat: 0.64 mg/dL (ref 0.50–1.05)
GFR, Est African American: 117 mL/min/{1.73_m2} (ref >=60)
GFR, Est Non African American: 101 mL/min/{1.73_m2} (ref >=60)
Globulin: 4.2 g/dL (calc) — ABNORMAL HIGH (ref 1.9–3.7)
Glucose, Bld: 79 mg/dL (ref 65–99)
Potassium: 3.9 mmol/L (ref 3.5–5.3)
Sodium: 139 mmol/L (ref 135–146)
Total Bilirubin: 0.7 mg/dL (ref 0.2–1.2)
Total Protein: 8.6 g/dL — ABNORMAL HIGH (ref 6.1–8.1)

## 2020-07-25 LAB — HIV-1 RNA QUANT-NO REFLEX-BLD
HIV 1 RNA Quant: NOT DETECTED Copies/mL
HIV-1 RNA Quant, Log: NOT DETECTED Log cps/mL

## 2020-07-27 DIAGNOSIS — R030 Elevated blood-pressure reading, without diagnosis of hypertension: Secondary | ICD-10-CM | POA: Insufficient documentation

## 2020-07-27 DIAGNOSIS — J302 Other seasonal allergic rhinitis: Secondary | ICD-10-CM | POA: Insufficient documentation

## 2020-07-27 NOTE — Assessment & Plan Note (Signed)
Nearly resolved.  No constitutional symptoms.  We will continue to follow.  She has not seen hematology in a few months.  Discussed that should this recur or get larger I would prefer to have it biopsied.

## 2020-07-27 NOTE — Assessment & Plan Note (Signed)
She has in the past required blood pressure medications.  Some of this is likely due to anxiety given today's visit.  I encouraged her to check her blood pressures at home and get more information and discuss with her PCP.  Recommended to avoid any over-the-counter decongestions as this can increase her blood pressure further.

## 2020-07-27 NOTE — Assessment & Plan Note (Signed)
Doing well on Biktarvy once daily.  We will check with her insurance again to see if we can switch her regimen to parenteral treatment.  We talked about some resources for discussing her medical problems with a new partner.  Pros and cons of early discussion versus waiting.  I also gave her some resources regarding specifically women's support and the role of HIV available online.   I will give her Pneumovax and DTaP today. Return in about 4 months (around 11/21/2020).

## 2020-07-27 NOTE — Assessment & Plan Note (Signed)
We will give her some Antivert to take up to 3 times daily for allergies and dizziness.  Flonase for nasal congestion.  Avoid any D containing products that would increase her blood pressure further.  Topical saline rinses.  Keeping windows down at night, changing air filters, vacuum carpets.

## 2020-07-28 ENCOUNTER — Telehealth: Payer: Self-pay

## 2020-07-28 NOTE — Telephone Encounter (Signed)
I attempted to contact the patient to relay lab results. Patient did not answer and no secured voicemail setup. No message left. Courtney Kim T Pricilla Loveless

## 2020-07-28 NOTE — Telephone Encounter (Signed)
-----   Message from Blanchard Kelch, NP sent at 07/27/2020 10:59 AM EDT ----- Please call Courtney Kim to let her know that her viral load is undetectable and her medication is working well. Her immune system continues to improve with her CD4 count up to 279 now.   Her insurance denied the [place]. I am going to try to do an appeal for her to see if they can reconsider covering it for her. If not I am grateful to be able to continue her biktarvy as it is working well for her.

## 2020-07-29 NOTE — Telephone Encounter (Signed)
Was able to connect with patient and relay results and cabenuva appeal in process. Patient verbalized understanding and was appreciative of call.  Valarie Cones

## 2020-08-14 ENCOUNTER — Other Ambulatory Visit: Payer: Self-pay | Admitting: Infectious Diseases

## 2020-08-14 DIAGNOSIS — B20 Human immunodeficiency virus [HIV] disease: Secondary | ICD-10-CM

## 2020-11-18 ENCOUNTER — Other Ambulatory Visit: Payer: Self-pay | Admitting: Infectious Diseases

## 2020-11-18 DIAGNOSIS — B20 Human immunodeficiency virus [HIV] disease: Secondary | ICD-10-CM

## 2020-12-26 ENCOUNTER — Other Ambulatory Visit: Payer: Self-pay | Admitting: Internal Medicine

## 2020-12-26 DIAGNOSIS — F17200 Nicotine dependence, unspecified, uncomplicated: Secondary | ICD-10-CM

## 2020-12-26 DIAGNOSIS — R634 Abnormal weight loss: Secondary | ICD-10-CM

## 2020-12-26 DIAGNOSIS — Z0000 Encounter for general adult medical examination without abnormal findings: Secondary | ICD-10-CM

## 2021-01-09 ENCOUNTER — Other Ambulatory Visit: Payer: Self-pay

## 2021-01-09 ENCOUNTER — Ambulatory Visit
Admission: RE | Admit: 2021-01-09 | Discharge: 2021-01-09 | Disposition: A | Discharge status: Home / Self Care | Payer: 59 | Source: Ambulatory Visit | Attending: Internal Medicine | Admitting: Internal Medicine

## 2021-01-09 DIAGNOSIS — F17200 Nicotine dependence, unspecified, uncomplicated: Secondary | ICD-10-CM

## 2021-01-09 DIAGNOSIS — R634 Abnormal weight loss: Secondary | ICD-10-CM

## 2021-01-09 DIAGNOSIS — Z0000 Encounter for general adult medical examination without abnormal findings: Secondary | ICD-10-CM

## 2021-03-06 ENCOUNTER — Other Ambulatory Visit: Payer: Self-pay | Admitting: Infectious Diseases

## 2021-03-06 DIAGNOSIS — B20 Human immunodeficiency virus [HIV] disease: Secondary | ICD-10-CM

## 2021-03-13 ENCOUNTER — Other Ambulatory Visit: Payer: Self-pay

## 2021-03-13 ENCOUNTER — Ambulatory Visit: Payer: Self-pay

## 2021-03-13 ENCOUNTER — Other Ambulatory Visit: Payer: PRIVATE HEALTH INSURANCE

## 2021-03-13 DIAGNOSIS — B20 Human immunodeficiency virus [HIV] disease: Secondary | ICD-10-CM

## 2021-03-13 DIAGNOSIS — Z113 Encounter for screening for infections with a predominantly sexual mode of transmission: Secondary | ICD-10-CM

## 2021-03-13 DIAGNOSIS — Z79899 Other long term (current) drug therapy: Secondary | ICD-10-CM

## 2021-03-14 ENCOUNTER — Other Ambulatory Visit (HOSPITAL_COMMUNITY): Payer: Self-pay

## 2021-03-14 LAB — T-HELPER CELL (CD4) - (RCID CLINIC ONLY)
CD4 % Helper T Cell: 32 % — ABNORMAL LOW (ref 33–65)
CD4 T Cell Abs: 358 /uL — ABNORMAL LOW (ref 400–1790)

## 2021-03-15 LAB — COMPLETE METABOLIC PANEL WITH GFR
AG Ratio: 1.2 (calc) (ref 1.0–2.5)
ALT: 7 U/L (ref 6–29)
AST: 14 U/L (ref 10–35)
Albumin: 4.5 g/dL (ref 3.6–5.1)
Alkaline phosphatase (APISO): 87 U/L (ref 37–153)
BUN/Creatinine Ratio: 10 (calc) (ref 6–22)
BUN: 12 mg/dL (ref 7–25)
CO2: 31 mmol/L (ref 20–32)
Calcium: 9.3 mg/dL (ref 8.6–10.4)
Chloride: 106 mmol/L (ref 98–110)
Creat: 1.21 mg/dL — ABNORMAL HIGH (ref 0.50–1.03)
Globulin: 3.9 g/dL (calc) — ABNORMAL HIGH (ref 1.9–3.7)
Glucose, Bld: 75 mg/dL (ref 65–99)
Potassium: 3.9 mmol/L (ref 3.5–5.3)
Sodium: 140 mmol/L (ref 135–146)
Total Bilirubin: 0.4 mg/dL (ref 0.2–1.2)
Total Protein: 8.4 g/dL — ABNORMAL HIGH (ref 6.1–8.1)
eGFR: 53 mL/min/{1.73_m2} — ABNORMAL LOW (ref >=60)

## 2021-03-15 LAB — CBC WITH DIFFERENTIAL/PLATELET
Absolute Monocytes: 480 cells/uL (ref 200–950)
Basophils Absolute: 40 cells/uL (ref 0–200)
Basophils Relative: 0.8 %
Eosinophils Absolute: 120 cells/uL (ref 15–500)
Eosinophils Relative: 2.4 %
HCT: 38.5 % (ref 35.0–45.0)
Hemoglobin: 12.6 g/dL (ref 11.7–15.5)
Lymphs Abs: 1125 cells/uL (ref 850–3900)
MCH: 29.2 pg (ref 27.0–33.0)
MCHC: 32.7 g/dL (ref 32.0–36.0)
MCV: 89.1 fL (ref 80.0–100.0)
MPV: 10.8 fL (ref 7.5–12.5)
Monocytes Relative: 9.6 %
Neutro Abs: 3235 cells/uL (ref 1500–7800)
Neutrophils Relative %: 64.7 %
Platelets: 287 10*3/uL (ref 140–400)
RBC: 4.32 10*6/uL (ref 3.80–5.10)
RDW: 12.8 % (ref 11.0–15.0)
Total Lymphocyte: 22.5 %
WBC: 5 10*3/uL (ref 3.8–10.8)

## 2021-03-15 LAB — HIV-1 RNA QUANT-NO REFLEX-BLD
HIV 1 RNA Quant: 60 Copies/mL — ABNORMAL HIGH
HIV-1 RNA Quant, Log: 1.78 Log cps/mL — ABNORMAL HIGH

## 2021-03-15 LAB — LIPID PANEL
Cholesterol: 161 mg/dL (ref <200)
HDL: 54 mg/dL (ref >=50)
LDL Cholesterol (Calc): 89 mg/dL (calc)
Non-HDL Cholesterol (Calc): 107 mg/dL (calc) (ref <130)
Total CHOL/HDL Ratio: 3 (calc) (ref <5.0)
Triglycerides: 85 mg/dL (ref <150)

## 2021-03-15 LAB — RPR: RPR Ser Ql: NONREACTIVE

## 2021-03-16 ENCOUNTER — Other Ambulatory Visit: Payer: Self-pay | Admitting: Pharmacist

## 2021-03-16 DIAGNOSIS — B20 Human immunodeficiency virus [HIV] disease: Secondary | ICD-10-CM

## 2021-03-16 MED ORDER — BICTEGRAVIR-EMTRICITAB-TENOFOV 50-200-25 MG PO TABS: 1.0000 | ORAL_TABLET | Freq: Every day | ORAL | 0 refills | Status: AC

## 2021-03-16 NOTE — Progress Notes (Signed)
Medication Samples have been provided to the patient. ° °Drug name: Biktarvy        °Strength: 50/200/25 mg       °Qty: 14 tablets (2 bottles) °LOT: [id number]   °Exp.Date: 11/20/22 ° °Dosing instructions: Take one tablet by mouth once daily ° °The patient has been instructed regarding the correct time, dose, and frequency of taking this medication, including desired effects and most common side effects.  ° °Iara Monds, PharmD, CPP °Clinical Pharmacist Practitioner °Infectious Diseases Clinical Pharmacist °[facility] ° °

## 2021-03-27 ENCOUNTER — Encounter: Payer: Self-pay | Admitting: Infectious Diseases

## 2021-03-28 ENCOUNTER — Encounter: Payer: Self-pay | Admitting: Infectious Diseases

## 2021-03-28 ENCOUNTER — Other Ambulatory Visit: Payer: Self-pay

## 2021-03-28 ENCOUNTER — Ambulatory Visit (INDEPENDENT_AMBULATORY_CARE_PROVIDER_SITE_OTHER): Payer: PRIVATE HEALTH INSURANCE | Admitting: Infectious Diseases

## 2021-03-28 VITALS — BP 164/97 | HR 73 | Temp 98.3°F | Ht 68.0 in | Wt 169.0 lb

## 2021-03-28 DIAGNOSIS — Z0000 Encounter for general adult medical examination without abnormal findings: Secondary | ICD-10-CM

## 2021-03-28 DIAGNOSIS — R030 Elevated blood-pressure reading, without diagnosis of hypertension: Secondary | ICD-10-CM

## 2021-03-28 DIAGNOSIS — J302 Other seasonal allergic rhinitis: Secondary | ICD-10-CM | POA: Diagnosis not present

## 2021-03-28 DIAGNOSIS — B20 Human immunodeficiency virus [HIV] disease: Secondary | ICD-10-CM

## 2021-03-28 NOTE — Assessment & Plan Note (Signed)
Pap smear to update next OV.

## 2021-03-28 NOTE — Assessment & Plan Note (Signed)
Well controlled with once daily biktarvy and recovery of CD4 to nearly 400 now after 2 years on ART consistently. Her lymphadenopathy has resolved completely. No side effects to her medication but would like to try her new insurance to see about possibly doing cabenuva injections to get her off daily maintanance.   Pap smear to be updated next OV.  Vaccines up to date.   RTC in 4-6 months pending cabenuva investigation

## 2021-03-28 NOTE — Patient Instructions (Addendum)
Allergies -   Pataday eye drops allergy over the counter   Flonase for the nasal congestion   For the antihistamine - can take claritin twice a day or switch to alternative like Allegra, Zyrtec or Xyzal (my favorite). Benadryl is also helpful at night if you are needing some sleep assistance.   Would like to see you back sometime 4-6 months and we can do your pap smear at that appointment. (Please request a 30 minute visit at check out).   We can do labs same day for you if you don't mind [place] calling you with results; otherwise we can have you back 2 weeks before your next appointment to do them ahead of time.

## 2021-03-28 NOTE — Progress Notes (Signed)
Subjective:    Patient ID: Courtney Kim is a 56 y.o. female     DOB: Feb 17, 1966   MRN: [id number]    CC: HIV follow up care.  Allergies!   HPI: Allergies are troubling her - largely coughing, nasal congestion, itchy watery eyes. She has resumed claritin for about 2 months now. Usually takes this and works well but still with symptoms.   She has continued on her biktarvy once a day. New insurance for her and would really like to try to see if she can do cabenuva with this insurance.   Experiencing more stress from work - has 2 jobs now to try to get bills paid for. No concerns for anxious or depressed mood. BP up a little today but typically is around that level from what she recalls when she checks it at work. She is vaping nicotine for stress relief currently.     Review of Systems  Constitutional:  Negative for chills and fever.  HENT:  Positive for congestion. Negative for sore throat and tinnitus.   Eyes:  Positive for discharge. Negative for blurred vision and photophobia.  Respiratory:  Positive for cough. Negative for sputum production.   Cardiovascular:  Negative for chest pain.  Gastrointestinal:  Negative for diarrhea, nausea and vomiting.  Genitourinary:  Negative for dysuria.  Skin:  Negative for rash.  Neurological:  Negative for dizziness and headaches.  Psychiatric/Behavioral:  Negative for depression. The patient is not nervous/anxious and does not have insomnia.     Outpatient Medications Prior to Visit  Medication Sig Dispense Refill   bictegravir-emtricitabine-tenofovir AF (BIKTARVY) 50-200-25 MG TABS tablet Take 1 tablet by mouth daily for 14 days. 14 tablet 0   ferrous sulfate 325 (65 FE) MG tablet Take 1 tablet (325 mg total) by mouth daily with breakfast. Please take with a source of Vitamin C 90 tablet 3   ibuprofen (ADVIL) 800 MG tablet TAKE 1 TABLET(800 MG) BY MOUTH EVERY 8 HOURS AS NEEDED 30 tablet 2   meclizine (ANTIVERT) 25 MG tablet Take 1 tablet  (25 mg total) by mouth 3 (three) times daily as needed for dizziness. 30 tablet 0   naproxen (NAPROSYN) 375 MG tablet Take 1 tablet (375 mg total) by mouth 2 (two) times daily. 20 tablet 0   aspirin EC 81 MG tablet Take 81 mg by mouth daily. Swallow whole. (Patient not taking: Reported on 07/22/2020)     BIKTARVY 50-200-25 MG TABS tablet TAKE 1 TABLET BY MOUTH AT THE SAME TIME EVERY DAY WITH OR WITHOUT FOOD 30 tablet 4   sulfamethoxazole-trimethoprim (BACTRIM) 400-80 MG tablet Take 1 tablet by mouth daily. (Patient not taking: Reported on 07/22/2020) 30 tablet 5   No facility-administered medications prior to visit.    Past Medical History:  Diagnosis Date   HIV infection (HCC)    Hypertension     Social History   Socioeconomic History   Marital status: Single    Spouse name: Not on file   Number of children: Not on file   Years of education: Not on file   Highest education level: Not on file  Occupational History   Not on file  Tobacco Use   Smoking status: Some Days    Types: E-cigarettes   Smokeless tobacco: Never   Tobacco comments:    States she vapes occasionally  Substance and Sexual Activity   Alcohol use: Never   Drug use: Never   Sexual activity: Not Currently    Partners: Male  Comment: accepted condoms  Other Topics Concern   Not on file  Social History Narrative   Leave alone   Smoke cigarette occasion   Social Determinants of Health   Financial Resource Strain: Not on file  Food Insecurity: Not on file  Transportation Needs: Not on file  Physical Activity: Not on file  Stress: Not on file  Social Connections: Not on file  Intimate Partner Violence: Not on file        Objective:    Today's Vitals   03/28/21 1044  BP: (!) 164/97  Pulse: 73  Temp: 98.3 F (36.8 C)  TempSrc: Oral  SpO2: 98%  Weight: 169 lb (76.7 kg)  Height: 5\' 8"  (1.727 m)   Body mass index is 25.7 kg/m.   Physical Exam Vitals reviewed.  Constitutional:       Appearance: Normal appearance. She is not ill-appearing.  HENT:     Nose: Congestion present.     Mouth/Throat:     Mouth: Mucous membranes are moist.     Pharynx: Oropharynx is clear.  Eyes:     General: No scleral icterus. Pulmonary:     Effort: Pulmonary effort is normal.  Neurological:     Mental Status: She is oriented to person, place, and time.  Psychiatric:        Mood and Affect: Mood normal.        Thought Content: Thought content normal.    LABS: Lab Results  Component Value Date   HIV1RNAQUANT 60 (H) 03/13/2021   HIV1RNAQUANT Not Detected 07/22/2020   HIV1RNAQUANT <20 04/07/2020    Lab Results  Component Value Date   CREATININE 1.21 (H) 03/13/2021   CREATININE 0.64 07/22/2020   CREATININE 1.16 (H) 10/23/2019    Lab Results  Component Value Date   ALT 7 03/13/2021   AST 14 03/13/2021   ALKPHOS 89 10/23/2019   BILITOT 0.4 03/13/2021    Lab Results  Component Value Date   WBC 5.0 03/13/2021   HGB 12.6 03/13/2021   HCT 38.5 03/13/2021   MCV 89.1 03/13/2021   PLT 287 03/13/2021    No results found for: RPR      Assessment & Plan:   Problem List Items Addressed This Visit       Unprioritized   HIV (human immunodeficiency virus infection) (HCC)    Well controlled with once daily biktarvy and recovery of CD4 to nearly 400 now after 2 years on ART consistently. Her lymphadenopathy has resolved completely. No side effects to her medication but would like to try her new insurance to see about possibly doing cabenuva injections to get her off daily maintanance.   Pap smear to be updated next OV.  Vaccines up to date.   RTC in 4-6 months pending cabenuva investigation       Healthcare maintenance    Pap smear to update next OV.       Elevated blood pressure reading    Has a FU with her PCP scheduled in a few months. Acutely elevated in the setting of stress.       Seasonal allergies    Recommend to switch antihistamine to alternative -  xyzal.  Flonase nasal spray, pataday eye gtts.  Benadryl at night.       Other Visit Diagnoses     Human immunodeficiency virus (HIV) disease (HCC)    -  Primary   Relevant Orders   HIV-1 RNA quant-no reflex-bld   COMPLETE METABOLIC PANEL WITH GFR  CBC with Differential/Platelet   T-helper cells (CD4) count (not at [facility])       Rexene Alberts, MSN, NP-C [facility]  [email or web address] Pager: [phone] Office: [phone] [facility] Main Line: [phone]

## 2021-03-28 NOTE — Assessment & Plan Note (Signed)
Recommend to switch antihistamine to alternative - xyzal.  Flonase nasal spray, pataday eye gtts.  Benadryl at night.

## 2021-03-28 NOTE — Assessment & Plan Note (Addendum)
Has a FU with her PCP scheduled in a few months. Acutely elevated in the setting of stress.

## 2021-04-03 ENCOUNTER — Other Ambulatory Visit (HOSPITAL_COMMUNITY): Payer: Self-pay

## 2021-04-06 ENCOUNTER — Other Ambulatory Visit: Payer: Self-pay | Admitting: Pharmacist

## 2021-04-06 ENCOUNTER — Other Ambulatory Visit: Payer: Self-pay | Admitting: Infectious Diseases

## 2021-04-06 DIAGNOSIS — B20 Human immunodeficiency virus [HIV] disease: Secondary | ICD-10-CM

## 2021-04-06 MED ORDER — BIKTARVY 50-200-25 MG PO TABS: 1.0000 | ORAL_TABLET | Freq: Every day | ORAL | 0 refills | Status: AC

## 2021-04-06 NOTE — Progress Notes (Signed)
Medication Samples have been provided to the patient.  Drug name: Biktarvy        Strength: 50/200/25 mg       Qty: 7 tablets (1 bottle )  LOT: [id number]   Exp.Date: 11/20/2022  Dosing instructions: Take one tablet by mouth once daily  The patient has been instructed regarding the correct time, dose, and frequency of taking this medication, including desired effects and most common side effects.   Arden Axon L. Eber Hong, PharmD, BCIDP, AAHIVP, CPP Clinical Pharmacist Practitioner Infectious Diseases [place] [facility] 02/01/2020, 10:07 AM

## 2021-04-17 ENCOUNTER — Other Ambulatory Visit (HOSPITAL_COMMUNITY): Payer: Self-pay

## 2021-06-05 ENCOUNTER — Other Ambulatory Visit: Payer: Self-pay

## 2021-06-05 DIAGNOSIS — B20 Human immunodeficiency virus [HIV] disease: Secondary | ICD-10-CM

## 2021-06-05 MED ORDER — BIKTARVY 50-200-25 MG PO TABS: ORAL_TABLET | ORAL | 0 refills | Status: DC

## 2021-06-13 ENCOUNTER — Ambulatory Visit (INDEPENDENT_AMBULATORY_CARE_PROVIDER_SITE_OTHER): Payer: PRIVATE HEALTH INSURANCE | Admitting: Pulmonary Disease

## 2021-06-13 ENCOUNTER — Encounter: Payer: Self-pay | Admitting: Pulmonary Disease

## 2021-06-13 VITALS — BP 138/90 | HR 89 | Ht 68.0 in | Wt 160.0 lb

## 2021-06-13 DIAGNOSIS — R053 Chronic cough: Secondary | ICD-10-CM

## 2021-06-13 DIAGNOSIS — F17210 Nicotine dependence, cigarettes, uncomplicated: Secondary | ICD-10-CM | POA: Diagnosis not present

## 2021-06-13 MED ORDER — SPIRIVA RESPIMAT 2.5 MCG/ACT IN AERS: 2.0000 | INHALATION_SPRAY | Freq: Every day | RESPIRATORY_TRACT | 0 refills | Status: AC

## 2021-06-13 MED ORDER — ALBUTEROL SULFATE HFA 108 (90 BASE) MCG/ACT IN AERS: 2.0000 | INHALATION_SPRAY | Freq: Four times a day (QID) | RESPIRATORY_TRACT | 0 refills | Status: DC | PRN

## 2021-06-13 NOTE — Patient Instructions (Addendum)
Chronic cough ?--ARRANGE for pulmonary function tests ?--Allergies: START fluticasone 1 spray per nare nightly Buy over-the counter ?--Reflux: START omeprazole 40 mg once a day. Buy over-the-counter ? ?Hx emphysema ?--Will evaluate with PFTs as above, pending results will still bronchodilator ?--START Albuterol AS NEEDED. Ok to use TWO puffs in the morning and night ? ?Follow-up with me after PFTs in May. Ok to schedule PFT and follow-up on different days ?

## 2021-06-13 NOTE — Progress Notes (Signed)
? ? ?Subjective:  ? ?PATIENT ID: Courtney Kim GENDER: female DOB: 1965-03-01, MRN: [id number] ? ? ?HPI ? ?Chief Complaint  ?Patient presents with  ? Consult  ?  Chronic cough about a yr  ? ? ?Reason for Visit: New consult for chronic cough ? 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 Thornell Mule at [facility] for chronic cough. ? ?Note from 05/19/21 reviewed from PCP. Chronic cough treated with omeprazole, tessalon perles. Advised to stop vaping. Referred to Pulmonary  ? ?She reports long standing cough for at least a year. Denies preceding illness. She reports the cough has progressively worsened and now interrupts her sleep. Coffee, laying down, strong odors, allergens seems to worsen her cough. Associated with shortness of breath and wheezing. She currently vapes twice a day and occasional smokes cigarettes. Reports nasal congestion and not taking anything. Denies known reflux and intermittently taking it. Denies asthma and denies recurrent respiratory illness.  ? ?Social History: ?Intermittent smokes. Vapes. 40 pack-years ?Started when she was smoking at 56 years old. At peak 2ppd. Currently 2 cigarettes daily ? ?I have personally reviewed patient's past medical/family/social history, allergies, current medications. ? ?Past Medical History:  ?Diagnosis Date  ? HIV infection (HCC)   ? Hypertension   ?  ? ?Family History  ?Problem Relation Age of Onset  ? Hypertension Mother   ? Sickle cell anemia Father   ? Breast cancer Paternal Grandmother   ?  ? ?Social History  ? ?Occupational History  ? Not on file  ?Tobacco Use  ? Smoking status: Some Days  ?  Packs/day: 2.00  ?  Years: 20.00  ?  Pack years: 40.00  ?  Types: E-cigarettes, Cigarettes  ? Smokeless tobacco: Never  ? Tobacco comments:  ?  States she vapes occasionally  ?Vaping Use  ? Vaping Use: Some days  ? Substances: Nicotine  ?Substance and Sexual  Activity  ? Alcohol use: Never  ? Drug use: Never  ? Sexual activity: Not Currently  ?  Partners: Male  ?  Comment: accepted condoms  ? ? ?Allergies  ?Allergen Reactions  ? Hydrocodone-Acetaminophen Other (See Comments)  ?  ? ?Outpatient Medications Prior to Visit  ?Medication Sig Dispense Refill  ? aspirin EC 81 MG tablet Take 81 mg by mouth daily. Swallow whole.    ? bictegravir-emtricitabine-tenofovir AF (BIKTARVY) 50-200-25 MG TABS tablet TAKE 1 TABLET BY MOUTH AT THE SAME TIME EVERY DAY WITH OR WITHOUT FOOD 90 tablet 0  ? ferrous sulfate 325 (65 FE) MG tablet Take 1 tablet (325 mg total) by mouth daily with breakfast. Please take with a source of Vitamin C 90 tablet 3  ? ibuprofen (ADVIL) 800 MG tablet TAKE 1 TABLET(800 MG) BY MOUTH EVERY 8 HOURS AS NEEDED 30 tablet 2  ? meclizine (ANTIVERT) 25 MG tablet Take 1 tablet (25 mg total) by mouth 3 (three) times daily as needed for dizziness. 30 tablet 0  ? naproxen (NAPROSYN) 375 MG tablet Take 1 tablet (375 mg total) by mouth 2 (two) times daily. 20 tablet 0  ? omeprazole (PRILOSEC) 40 MG capsule Take 40 mg by mouth daily.    ? sulfamethoxazole-trimethoprim (BACTRIM) 400-80 MG tablet Take 1 tablet by mouth daily. 30 tablet 5  ? ?No facility-administered medications prior to visit.  ? ? ?Review of Systems  ?Constitutional:  Negative for chills, diaphoresis, fever, malaise/fatigue and weight loss.  ?HENT:  Positive  for congestion.   ?Respiratory:  Positive for cough. Negative for hemoptysis, sputum production, shortness of breath and wheezing.   ?Cardiovascular:  Negative for chest pain, palpitations and leg swelling.  ? ? ?Objective:  ? ?Vitals:  ? 06/13/21 0949  ?BP: 138/90  ?Pulse: 89  ?SpO2: 100%  ?Weight: 160 lb (72.6 kg)  ?Height: 5\' 8"  (1.727 m)  ?SpO2: 100 % ?O2 Device: None (Room air) ? ?Physical Exam: ?General: Well-appearing, no acute distress ?HENT: [place], AT ?Eyes: EOMI, no scleral icterus ?Respiratory: Clear to auscultation bilaterally.  No crackles,  wheezing or rales ?Cardiovascular: RRR, -M/R/G, no JVD ?Extremities:-Edema,-tenderness ?Neuro: AAO x4, CNII-XII grossly intact ?Psych: Normal mood, normal affect ? ?Data Reviewed: ? ?Imaging: ?CT chest HR 01/09/2021-minimal centrilobular emphysema.  Enlarged bilateral axillary lymph nodes ? ?PFT: ?None on file ? ?Labs: ?Absolute eos 12/16/20 - 100 ? ?   ?Assessment & Plan:  ? 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 Common causes of cough were discussed including upper airway cough syndrome, reflux and undiagnosed obstructive lung disease. She does have emphysema so may require bronchodilators in the future. We reviewed evaluation and management for cough as noted below. ? ?Chronic cough ?--ARRANGE for pulmonary function tests ?--Allergies: START fluticasone 1 spray per nare nightly Buy over-the counter ?--Reflux: START omeprazole 40 mg once a day. Buy over-the-counter ? ?Hx emphysema ?--Will evaluate with PFTs as above, pending results will still bronchodilator ?--START Albuterol AS NEEDED. Ok to use TWO puffs in the morning and night ?--Spiriva 2.5 mcg samples provided. Inhaler teaching demonstrated ? ?Tobacco abuse ?Patient is an active smoker. She is working on cutting down and stopping vaping ?We discussed smoking cessation for 3 minutes. We discussed triggers and stressors and ways to deal with them. We discussed barriers to continued smoking and benefits of smoking cessation. Provided patient with information cessation techniques and interventions  ? ? ?Health Maintenance ?Immunization History  ?Administered Date(s) Administered  ? Hepatitis A 05/04/2014, 07/23/2014  ? Hepatitis B 05/04/2014, 07/23/2014  ? PFIZER(Purple Top)SARS-COV-2 Vaccination 10/29/2019, 11/19/2019  ? Pneumococcal Conjugate-13 04/07/2020  ? Pneumococcal Polysaccharide-23 07/22/2020  ? Td 08/01/2005  ? Tdap 08/01/2005, 07/22/2020  ? ?CT Lung  Screen- discuss at next visit ? ?Orders Placed This Encounter  ?Procedures  ? Pulmonary function test  ?  Standing Status:   Future  ?  Standing Expiration Date:   06/14/2022  ?  Order Specific Question:   Where should this test be performed?  ?  Answer:   [facility]  ?  Order Specific Question:   Full PFT: includes the following: basic spirometry, spirometry pre & post bronchodilator, diffusion capacity (DLCO), lung volumes  ?  Answer:   Full PFT  ? ?Meds ordered this encounter  ?Medications  ? albuterol (VENTOLIN HFA) 108 (90 Base) MCG/ACT inhaler  ?  Sig: Inhale 2 puffs into the lungs every 6 (six) hours as needed for wheezing or shortness of breath.  ?  Dispense:  1 each  ?  Refill:  0  ? Tiotropium Bromide Monohydrate (SPIRIVA RESPIMAT) 2.5 MCG/ACT AERS  ?  Sig: Inhale 2 puffs into the lungs daily.  ?  Dispense:  4 g  ?  Refill:  0  ? ? ?Return in about 1 month (around 07/13/2021). ? ?I have spent a total time of 45-minutes on the day of the appointment reviewing prior documentation, coordinating care and discussing medical diagnosis and plan with  the patient/family. Imaging, labs and tests included in this note have been reviewed and interpreted independently by me. ? ?Courtney Gail Mechele CollinJane Juana Montini, Courtney Kim ?[facility] ?06/13/2021 10:22 AM  ?Office Number [phone] ? ? ?

## 2021-07-13 ENCOUNTER — Ambulatory Visit
Admission: EM | Admit: 2021-07-13 | Discharge: 2021-07-13 | Disposition: A | Discharge status: Home / Self Care | Payer: PRIVATE HEALTH INSURANCE | Attending: Student | Admitting: Student

## 2021-07-13 DIAGNOSIS — N760 Acute vaginitis: Secondary | ICD-10-CM | POA: Diagnosis present

## 2021-07-13 MED ORDER — FLUCONAZOLE 150 MG PO TABS: 150.0000 mg | ORAL_TABLET | Freq: Every day | ORAL | 0 refills | Status: DC

## 2021-07-13 NOTE — Discharge Instructions (Addendum)
-  For your yeast infection, start the Diflucan (fluconazole)- Take one pill today (day 1). If you're still having symptoms in 3 days, take the second pill.  -We have sent testing for sexually transmitted infections. We will notify you of any positive results once they are received. If required, we will prescribe any medications you might need. Please refrain from all sexual activity until treatment is complete.  -Seek additional medical attention if you develop fevers/chills, new/worsening abdominal pain, new/worsening vaginal discomfort/discharge, etc.   

## 2021-07-13 NOTE — ED Provider Notes (Signed)
[facility]    CSN: [id number] Arrival date & time: 07/13/21  1402      History   Chief Complaint Chief Complaint  Patient presents with   Vaginitis    HPI Courtney Kim is a 56 y.o. female presenting with vaginal irritation and itching for 1 week.  History HIV.  States that itching is the predominant symptom, she is having no discharge or vaginal odor.  She does note new female partner, but they have used barrier protection each time.  Also notes recent antibiotic for dental infection.  Has not attempted interventions for the symptoms yet. Denies hematuria, dysuria, frequency, urgency, back pain, n/v/d/abd pain, fevers/chills, abdnormal vaginal discharge.   HPI  Past Medical History:  Diagnosis Date   HIV infection (HCC)    Hypertension     Patient Active Problem List   Diagnosis Date Noted   Chronic cough 06/13/2021   Elevated blood pressure reading 07/27/2020   Seasonal allergies 07/27/2020   Anemia 12/17/2019   Healthcare maintenance 09/21/2019   HIV (human immunodeficiency virus infection) (HCC) 05/05/2019    Past Surgical History:  Procedure Laterality Date   ABDOMINAL HYSTERECTOMY     BREAST BIOPSY Right unknown   Patient does not recall when clips were placed    OB History   No obstetric history on file.      Home Medications    Prior to Admission medications   Medication Sig Start Date End Date Taking? Authorizing Provider  fluconazole (DIFLUCAN) 150 MG tablet Take 1 tablet (150 mg total) by mouth daily. -For your yeast infection, start the Diflucan (fluconazole)- Take one pill today (day 1). If you're still having symptoms in 3 days, take the second pill. 07/13/21  Yes Rhys MartiniGraham, Reverie Vaquera E, PA-C  albuterol (VENTOLIN HFA) 108 (90 Base) MCG/ACT inhaler Inhale 2 puffs into the lungs every 6 (six) hours as needed for wheezing or shortness of breath. 06/13/21   Luciano CutterEllison, Chi Jane, MD  aspirin EC 81 MG tablet Take 81 mg by mouth daily. Swallow whole.     [provider]  bictegravir-emtricitabine-tenofovir AF (BIKTARVY) 50-200-25 MG TABS tablet TAKE 1 TABLET BY MOUTH AT THE SAME TIME EVERY DAY WITH OR WITHOUT FOOD 06/05/21   Blanchard Kelchixon, Stephanie N, NP  ferrous sulfate 325 (65 FE) MG tablet Take 1 tablet (325 mg total) by mouth daily with breakfast. Please take with a source of Vitamin C 07/23/19   Jaci Standardorsey, John T IV, MD  meclizine (ANTIVERT) 25 MG tablet Take 1 tablet (25 mg total) by mouth 3 (three) times daily as needed for dizziness. 07/22/20   Blanchard Kelchixon, Stephanie N, NP  omeprazole (PRILOSEC) 40 MG capsule Take 40 mg by mouth daily. 05/19/21   [provider]  sulfamethoxazole-trimethoprim (BACTRIM) 400-80 MG tablet Take 1 tablet by mouth daily. 08/11/19   Kuppelweiser, Cassie L, RPH-CPP  Tiotropium Bromide Monohydrate (SPIRIVA RESPIMAT) 2.5 MCG/ACT AERS Inhale 2 puffs into the lungs daily. 06/13/21   Luciano CutterEllison, Chi Jane, MD    Family History Family History  Problem Relation Age of Onset   Hypertension Mother    Sickle cell anemia Father    Breast cancer Paternal Grandmother     Social History Social History   Tobacco Use   Smoking status: Some Days    Packs/day: 2.00    Years: 20.00    Pack years: 40.00    Types: E-cigarettes, Cigarettes   Smokeless tobacco: Never   Tobacco comments:    States she vapes occasionally  Vaping  Use   Vaping Use: Some days   Substances: Nicotine  Substance Use Topics   Alcohol use: Never   Drug use: Never     Allergies   Hydrocodone-acetaminophen   Review of Systems Review of Systems  Constitutional:  Negative for chills and fever.  HENT:  Negative for sore throat.   Eyes:  Negative for pain and redness.  Respiratory:  Negative for shortness of breath.   Cardiovascular:  Negative for chest pain.  Gastrointestinal:  Negative for abdominal pain, diarrhea, nausea and vomiting.  Genitourinary:  Negative for decreased urine volume, difficulty urinating, dysuria, flank pain, frequency,  genital sores, hematuria, urgency and vaginal discharge.  Musculoskeletal:  Negative for back pain.  Skin:  Negative for rash.  All other systems reviewed and are negative.   Physical Exam Triage Vital Signs ED Triage Vitals  Enc Vitals Group     BP 07/13/21 1418 (!) 169/96     Pulse Rate 07/13/21 1418 75     Resp 07/13/21 1418 18     Temp 07/13/21 1418 98.6 F (37 C)     Temp src --      SpO2 07/13/21 1418 98 %     Weight --      Height --      Head Circumference --      Peak Flow --      Pain Score 07/13/21 1415 0     Pain Loc --      Pain Edu? --      Excl. in [place]? --    No data found.  Updated Vital Signs BP (!) 169/96   Pulse 75   Temp 98.6 F (37 C)   Resp 18   SpO2 98%   Visual Acuity Right Eye Distance:   Left Eye Distance:   Bilateral Distance:    Right Eye Near:   Left Eye Near:    Bilateral Near:     Physical Exam Vitals reviewed.  Constitutional:      General: She is not in acute distress.    Appearance: Normal appearance. She is not ill-appearing.  HENT:     Head: Normocephalic and atraumatic.     Mouth/Throat:     Mouth: Mucous membranes are moist.     Comments: Moist mucous membranes Eyes:     Extraocular Movements: Extraocular movements intact.     Pupils: Pupils are equal, round, and reactive to light.  Cardiovascular:     Rate and Rhythm: Normal rate and regular rhythm.     Heart sounds: Normal heart sounds.  Pulmonary:     Effort: Pulmonary effort is normal.     Breath sounds: Normal breath sounds. No wheezing, rhonchi or rales.  Abdominal:     General: Bowel sounds are normal. There is no distension.     Palpations: Abdomen is soft. There is no mass.     Tenderness: There is no abdominal tenderness. There is no right CVA tenderness, left CVA tenderness, guarding or rebound.  Genitourinary:    Comments: declined Skin:    General: Skin is warm.     Capillary Refill: Capillary refill takes less than 2 seconds.     Comments: Good  skin turgor  Neurological:     General: No focal deficit present.     Mental Status: She is alert and oriented to person, place, and time.  Psychiatric:        Mood and Affect: Mood normal.        Behavior:  Behavior normal.     UC Treatments / Results  Labs (all labs ordered are listed, but only abnormal results are displayed) Labs Reviewed  CERVICOVAGINAL ANCILLARY ONLY    EKG   Radiology No results found.  Procedures Procedures (including critical care time)  Medications Ordered in UC Medications - No data to display  Initial Impression / Assessment and Plan / UC Course  I have reviewed the triage vital signs and the nursing notes.  Pertinent labs & imaging results that were available during my care of the patient were reviewed by me and considered in my medical decision making (see chart for details).     This patient is a very pleasant 56 y.o. year old female presenting with vaginitis following abx. Afebrile, nontachycardic, no reproducible abd pain or CVAT.  Will send self-swab for G/C, trich, yeast, BV testing. Declines HIV, RPR. Safe sex precautions.   Will manage for presumed vaginal candidiasis with diflucan as below.   ED return precautions discussed. Patient verbalizes understanding and agreement.  .   Final Clinical Impressions(s) / UC Diagnoses   Final diagnoses:  Vaginitis and vulvovaginitis     Discharge Instructions      -For your yeast infection, start the Diflucan (fluconazole)- Take one pill today (day 1). If you're still having symptoms in 3 days, take the second pill.  -We have sent testing for sexually transmitted infections. We will notify you of any positive results once they are received. If required, we will prescribe any medications you might need. Please refrain from all sexual activity until treatment is complete.  -Seek additional medical attention if you develop fevers/chills, new/worsening abdominal pain, new/worsening vaginal  discomfort/discharge, etc.     ED Prescriptions     Medication Sig Dispense Auth. Provider   fluconazole (DIFLUCAN) 150 MG tablet Take 1 tablet (150 mg total) by mouth daily. -For your yeast infection, start the Diflucan (fluconazole)- Take one pill today (day 1). If you're still having symptoms in 3 days, take the second pill. 2 tablet Hazel Sams, PA-C      PDMP not reviewed this encounter.   Hazel Sams, PA-C 07/13/21 1441

## 2021-07-13 NOTE — ED Triage Notes (Signed)
Patient presents to Urgent Care with complaints of possible yeast infection. Pt states she has itchiness x 1 week. Not taking any medications.

## 2021-07-18 LAB — CERVICOVAGINAL ANCILLARY ONLY
Bacterial Vaginitis (gardnerella): POSITIVE — AB
Candida Glabrata: NEGATIVE
Candida Vaginitis: NEGATIVE
Chlamydia: NEGATIVE
Comment: NEGATIVE
Comment: NEGATIVE
Comment: NEGATIVE
Comment: NEGATIVE
Comment: NEGATIVE
Comment: NORMAL
Neisseria Gonorrhea: NEGATIVE
Trichomonas: NEGATIVE

## 2021-07-19 ENCOUNTER — Telehealth (HOSPITAL_COMMUNITY): Payer: Self-pay | Admitting: Emergency Medicine

## 2021-07-19 MED ORDER — METRONIDAZOLE 500 MG PO TABS: 500.0000 mg | ORAL_TABLET | Freq: Two times a day (BID) | ORAL | 0 refills | Status: DC

## 2021-07-25 ENCOUNTER — Encounter (HOSPITAL_COMMUNITY): Payer: Self-pay

## 2021-07-25 ENCOUNTER — Emergency Department (HOSPITAL_COMMUNITY)
Admission: EM | Admit: 2021-07-25 | Discharge: 2021-07-25 | Disposition: A | Discharge status: Home / Self Care | Payer: PRIVATE HEALTH INSURANCE | Attending: Emergency Medicine | Admitting: Emergency Medicine

## 2021-07-25 DIAGNOSIS — Z7982 Long term (current) use of aspirin: Secondary | ICD-10-CM | POA: Diagnosis not present

## 2021-07-25 DIAGNOSIS — N751 Abscess of Bartholin's gland: Secondary | ICD-10-CM | POA: Insufficient documentation

## 2021-07-25 MED ORDER — LIDOCAINE HCL 2 % IJ SOLN
10.0000 mL | Freq: Once | INTRAMUSCULAR | Status: AC
  Administered 2021-07-25: 200 mg
  Filled 2021-07-25: qty 20

## 2021-07-25 MED ORDER — AMOXICILLIN-POT CLAVULANATE 875-125 MG PO TABS: 1.0000 | ORAL_TABLET | Freq: Two times a day (BID) | ORAL | 0 refills | Status: DC

## 2021-07-25 NOTE — Discharge Instructions (Addendum)
Return if any problems.

## 2021-07-25 NOTE — ED Provider Notes (Signed)
[facility]-EMERGENCY DEPT Provider Note   CSN: [id number] Arrival date & time: 07/25/21  1112     History  Chief Complaint  Patient presents with   Abscess    Courtney Kim is a 56 y.o. female.  Patient complains of a swollen area to her right labia patient reports this area has been present for the past week and is getting worse  The history is provided by the patient. No language interpreter was used.  Abscess Location:  Torso Torso abscess location: Right labia. Size:  2 cm Abscess quality: painful and warmth   Red streaking: no   Duration:  1 week Progression:  Worsening Pain details:    Quality:  Throbbing   Severity:  Moderate   Duration:  1 week   Timing:  Constant   Progression:  Worsening Chronicity:  New     Home Medications Prior to Admission medications   Medication Sig Start Date End Date Taking? Authorizing Provider  amoxicillin-clavulanate (AUGMENTIN) 875-125 MG tablet Take 1 tablet by mouth 2 (two) times daily. 07/25/21  Yes Cheron Schaumann K, PA-C  albuterol (VENTOLIN HFA) 108 (90 Base) MCG/ACT inhaler Inhale 2 puffs into the lungs every 6 (six) hours as needed for wheezing or shortness of breath. 06/13/21   Luciano Cutter, MD  aspirin EC 81 MG tablet Take 81 mg by mouth daily. Swallow whole.    [provider]  bictegravir-emtricitabine-tenofovir AF (BIKTARVY) 50-200-25 MG TABS tablet TAKE 1 TABLET BY MOUTH AT THE SAME TIME EVERY DAY WITH OR WITHOUT FOOD 06/05/21   Blanchard Kelch, NP  ferrous sulfate 325 (65 FE) MG tablet Take 1 tablet (325 mg total) by mouth daily with breakfast. Please take with a source of Vitamin C 07/23/19   Jaci Standard, MD  fluconazole (DIFLUCAN) 150 MG tablet Take 1 tablet (150 mg total) by mouth daily. -For your yeast infection, start the Diflucan (fluconazole)- Take one pill today (day 1). If you're still having symptoms in 3 days, take the second pill. 07/13/21   Rhys Martini, PA-C   meclizine (ANTIVERT) 25 MG tablet Take 1 tablet (25 mg total) by mouth 3 (three) times daily as needed for dizziness. 07/22/20   Blanchard Kelch, NP  metroNIDAZOLE (FLAGYL) 500 MG tablet Take 1 tablet (500 mg total) by mouth 2 (two) times daily. 07/19/21   Merrilee Jansky, MD  omeprazole (PRILOSEC) 40 MG capsule Take 40 mg by mouth daily. 05/19/21   [provider]  sulfamethoxazole-trimethoprim (BACTRIM) 400-80 MG tablet Take 1 tablet by mouth daily. 08/11/19   Kuppelweiser, Cassie L, RPH-CPP  Tiotropium Bromide Monohydrate (SPIRIVA RESPIMAT) 2.5 MCG/ACT AERS Inhale 2 puffs into the lungs daily. 06/13/21   Luciano Cutter, MD      Allergies    Hydrocodone-acetaminophen    Review of Systems   Review of Systems  Genitourinary:  Positive for vaginal pain.  All other systems reviewed and are negative.  Physical Exam Updated Vital Signs BP (!) 177/105 (BP Location: Left Arm)   Pulse 93   Temp 98.2 F (36.8 C) (Oral)   Resp 18   Ht 5\' 8"  (1.727 m)   Wt 64.5 kg   SpO2 97%   BMI 21.61 kg/m  Physical Exam Vitals and nursing note reviewed.  Constitutional:      Appearance: She is well-developed.  HENT:     Head: Normocephalic.  Cardiovascular:     Rate and Rhythm: Normal rate.  Pulmonary:  Effort: Pulmonary effort is normal.  Abdominal:     General: There is no distension.  Genitourinary:    Comments: 2 cm swollen area right inner labia tender to palpation Musculoskeletal:        General: Normal range of motion.     Cervical back: Normal range of motion.  Skin:    General: Skin is warm.  Neurological:     General: No focal deficit present.     Mental Status: She is alert and oriented to person, place, and time.    ED Results / Procedures / Treatments   Labs (all labs ordered are listed, but only abnormal results are displayed) Labs Reviewed - No data to display  EKG None  Radiology No results found.  Procedures .Marland KitchenIncision and Drainage  Date/Time:  07/25/2021 4:14 PM Performed by: Elson Areas, PA-C Authorized by: Elson Areas, PA-C   Consent:    Consent obtained:  Verbal   Consent given by:  Patient   Risks discussed:  Bleeding, incomplete drainage, pain and damage to other organs   Alternatives discussed:  No treatment Universal protocol:    Procedure explained and questions answered to patient or proxy's satisfaction: yes     Relevant documents present and verified: yes     Test results available : yes     Imaging studies available: yes     Required blood products, implants, devices, and special equipment available: yes     Site/side marked: yes     Immediately prior to procedure, a time out was called: yes     Patient identity confirmed:  Verbally with patient Location:    Type:  Abscess Pre-procedure details:    Skin preparation:  Betadine Anesthesia:    Anesthesia method:  Local infiltration   Local anesthetic:  Lidocaine 1% WITH epi Procedure type:    Complexity:  Complex Procedure details:    Incision types:  Single straight   Incision depth:  Subcutaneous   Wound management:  Probed and deloculated, irrigated with saline and extensive cleaning   Drainage:  Purulent   Drainage amount:  Moderate   Wound treatment:  Wound left open   Packing materials:  Word catheter Post-procedure details:    Procedure completion:  Tolerated well, no immediate complications    Medications Ordered in ED Medications  lidocaine (XYLOCAINE) 2 % (with pres) injection 200 mg (200 mg Infiltration Given by Other 07/25/21 1536)    ED Course/ Medical Decision Making/ A&P                           Medical Decision Making Risk Prescription drug management.   Patient counseled on follow-up she is advised to call and schedule follow-up with gynecology and in 2 weeks patient is given a prescription for Augmentin.  She is advised to return if any problems        Final Clinical Impression(s) / ED Diagnoses Final diagnoses:   Abscess of right Bartholin's gland    Rx / DC Orders ED Discharge Orders          Ordered    amoxicillin-clavulanate (AUGMENTIN) 875-125 MG tablet  2 times daily        07/25/21 1532           An After Visit Summary was printed and given to the patient.    Elson Areas, [place] 07/25/21 1615    Glynn Octave, MD 07/25/21 [phone]

## 2021-07-25 NOTE — ED Triage Notes (Signed)
Pt presents with c/o possible abscess from an ingrown hair to the left groin area, present for several days.

## 2021-07-26 ENCOUNTER — Other Ambulatory Visit: Payer: Self-pay | Admitting: Pulmonary Disease

## 2021-08-03 ENCOUNTER — Other Ambulatory Visit: Payer: Self-pay | Admitting: Pulmonary Disease

## 2021-08-03 ENCOUNTER — Ambulatory Visit (INDEPENDENT_AMBULATORY_CARE_PROVIDER_SITE_OTHER): Payer: PRIVATE HEALTH INSURANCE | Admitting: Pulmonary Disease

## 2021-08-03 ENCOUNTER — Encounter: Payer: Self-pay | Admitting: Pulmonary Disease

## 2021-08-03 VITALS — BP 130/90 | HR 82 | Temp 97.7°F | Ht 68.0 in | Wt 159.0 lb

## 2021-08-03 DIAGNOSIS — J438 Other emphysema: Secondary | ICD-10-CM | POA: Diagnosis not present

## 2021-08-03 DIAGNOSIS — R053 Chronic cough: Secondary | ICD-10-CM

## 2021-08-03 LAB — PULMONARY FUNCTION TEST
DL/VA % pred: 111 %
DL/VA: 4.61 ml/min/mmHg/L
DLCO cor % pred: 108 %
DLCO cor: 25.48 ml/min/mmHg
DLCO unc % pred: 108 %
DLCO unc: 25.48 ml/min/mmHg
FEF 25-75 Post: 2.88 L/sec
FEF 25-75 Pre: 2.02 L/sec
FEF2575-%Change-Post: 42 %
FEF2575-%Pred-Post: 112 %
FEF2575-%Pred-Pre: 79 %
FEV1-%Change-Post: 3 %
FEV1-%Pred-Post: 88 %
FEV1-%Pred-Pre: 84 %
FEV1-Post: 2.27 L
FEV1-Pre: 2.19 L
FEV1FVC-%Change-Post: 2 %
FEV1FVC-%Pred-Pre: 104 %
FEV6-%Change-Post: 3 %
FEV6-%Pred-Post: 83 %
FEV6-%Pred-Pre: 80 %
FEV6-Post: 2.64 L
FEV6-Pre: 2.55 L
FEV6FVC-%Change-Post: 2 %
FEV6FVC-%Pred-Post: 102 %
FEV6FVC-%Pred-Pre: 100 %
FVC-%Change-Post: 1 %
FVC-%Pred-Post: 81 %
FVC-%Pred-Pre: 80 %
FVC-Post: 2.65 L
FVC-Pre: 2.61 L
Post FEV1/FVC ratio: 86 %
Post FEV6/FVC ratio: 100 %
Pre FEV1/FVC ratio: 84 %
Pre FEV6/FVC Ratio: 98 %
RV % pred: 56 %
RV: 1.17 L
TLC % pred: 78 %
TLC: 4.44 L

## 2021-08-03 MED ORDER — FLUTICASONE-SALMETEROL 250-50 MCG/ACT IN AEPB: 1.0000 | INHALATION_SPRAY | Freq: Two times a day (BID) | RESPIRATORY_TRACT | 5 refills | Status: DC

## 2021-08-03 MED ORDER — FLUTICASONE FUROATE-VILANTEROL 200-25 MCG/ACT IN AEPB: 1.0000 | INHALATION_SPRAY | Freq: Every day | RESPIRATORY_TRACT | 5 refills | Status: DC

## 2021-08-03 NOTE — Telephone Encounter (Signed)
Advair ordered. Please contact patient regarding change in inhaler and that it needs to be taken twice a day.

## 2021-08-03 NOTE — Addendum Note (Signed)
Addended by: Mechele Collin on: 08/03/2021 04:42 PM   Modules accepted: Orders

## 2021-08-03 NOTE — Patient Instructions (Signed)
Chronic cough --CONTINUE fluticasone 1 spray per nare nightly Buy over-the counter --STOP omeprazole. Ineffective --Start inhaler as noted below  Hx emphysema --STOP Spiriva. Causes coughing  --START Breo 200. ONE puff ONCE a day. This is your EVERYDAY medication --CONTINUE Albuterol AS NEEDED  Follow-up with me in 3 months

## 2021-08-03 NOTE — Progress Notes (Signed)
Full PFT performed today. °

## 2021-08-03 NOTE — Patient Instructions (Signed)
Full PFT performed today. °

## 2021-08-03 NOTE — Progress Notes (Addendum)
Subjective:   PATIENT ID: Courtney Kim GENDER: female DOB: 02-Oct-1965, MRN: [id number]   HPI  Chief Complaint  Patient presents with   Follow-up    PFT performed today.  Pt states the cough is about the same since last visit.    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  Synopsis: Initially referred by Dr. Thornell Mule at [facility] for chronic cough. Note from 05/19/21 reviewed from PCP. Chronic cough treated with omeprazole, tessalon perles. Advised to stop vaping. Referred to Pulmonary  She reports long standing cough for at least a year. Denies preceding illness. She reports the cough has progressively worsened and now interrupts her sleep. Coffee, laying down, strong odors, allergens seems to worsen her cough. Associated with shortness of breath and wheezing. She currently vapes twice a day and occasional smokes cigarettes. Reports nasal congestion and not taking anything. Denies known reflux and intermittently taking it. Denies asthma and denies recurrent respiratory illness.   08/03/21 Since our last visit she continues to have chronic nonproductive cough that is worse at night. Associated with sometimes wheeze and shortness of breath. Still vaping 2-3 times a day. Has been compliant flonase and reflux meds without any changes in cough. Spiriva did not seem to help and aggravated the cough.  Social History: Intermittent smokes. Vapes. 40 pack-years Started when she was smoking at 56 years old. At peak 2ppd. Currently 2 cigarettes daily  Past Medical History:  Diagnosis Date   HIV infection (HCC)    Hypertension      Family History  Problem Relation Age of Onset   Hypertension Mother    Sickle cell anemia Father    Breast cancer Paternal Grandmother      Social History   Occupational History   Not on file  Tobacco Use    Smoking status: Some Days    Packs/day: 2.00    Years: 20.00    Total pack years: 40.00    Types: E-cigarettes, Cigarettes   Smokeless tobacco: Never   Tobacco comments:    States she vapes occasionally  Vaping Use   Vaping Use: Some days   Substances: Nicotine  Substance and Sexual Activity   Alcohol use: Never   Drug use: Never   Sexual activity: Not Currently    Partners: Male    Comment: accepted condoms    Allergies  Allergen Reactions   Hydrocodone-Acetaminophen Other (See Comments)     Outpatient Medications Prior to Visit  Medication Sig Dispense Refill   albuterol (VENTOLIN HFA) 108 (90 Base) MCG/ACT inhaler TAKE 2 PUFFS BY MOUTH EVERY 6 HOURS AS NEEDED FOR WHEEZE OR SHORTNESS OF BREATH 18 each 0   aspirin EC 81 MG tablet Take 81 mg by mouth daily. Swallow whole.     bictegravir-emtricitabine-tenofovir AF (BIKTARVY) 50-200-25 MG TABS tablet TAKE 1 TABLET BY MOUTH AT THE SAME TIME EVERY DAY WITH OR WITHOUT FOOD 90 tablet 0   ferrous sulfate 325 (65 FE) MG tablet Take 1 tablet (325 mg total) by mouth daily with breakfast. Please take with a source of Vitamin C 90 tablet 3   fluconazole (DIFLUCAN) 150 MG tablet Take 1 tablet (150 mg total) by mouth daily. -For your yeast infection, start the Diflucan (fluconazole)- Take one pill today (day 1). If you're still having symptoms in 3 days, take the second pill. 2 tablet 0   meclizine (ANTIVERT)  25 MG tablet Take 1 tablet (25 mg total) by mouth 3 (three) times daily as needed for dizziness. 30 tablet 0   omeprazole (PRILOSEC) 40 MG capsule Take 40 mg by mouth daily.     sulfamethoxazole-trimethoprim (BACTRIM) 400-80 MG tablet Take 1 tablet by mouth daily. 30 tablet 5   Tiotropium Bromide Monohydrate (SPIRIVA RESPIMAT) 2.5 MCG/ACT AERS Inhale 2 puffs into the lungs daily. 4 g 0   amoxicillin-clavulanate (AUGMENTIN) 875-125 MG tablet Take 1 tablet by mouth 2 (two) times daily. 20 tablet 0   metroNIDAZOLE (FLAGYL) 500 MG tablet  Take 1 tablet (500 mg total) by mouth 2 (two) times daily. 14 tablet 0   No facility-administered medications prior to visit.    Review of Systems  Constitutional:  Negative for chills, diaphoresis, fever, malaise/fatigue and weight loss.  HENT:  Negative for congestion.   Respiratory:  Positive for cough, shortness of breath and wheezing. Negative for hemoptysis and sputum production.   Cardiovascular:  Negative for chest pain, palpitations and leg swelling.     Objective:   Vitals:   08/03/21 1006  BP: 130/90  Pulse: 82  Temp: 97.7 F (36.5 C)  TempSrc: Oral  SpO2: 98%  Weight: 159 lb (72.1 kg)  Height: 5\' 8"  (1.727 m)  SpO2: 98 % (RA) O2 Device: None (Room air)  Physical Exam: General: Well-appearing, no acute distress HENT: [place], AT Eyes: EOMI, no scleral icterus Respiratory: Clear to auscultation bilaterally.  No crackles, wheezing or rales Cardiovascular: RRR, -M/R/G, no JVD Extremities:-Edema,-tenderness Neuro: AAO x4, CNII-XII grossly intact Psych: Normal mood, normal affect  Data Reviewed:  Imaging: CT chest HR 01/09/2021-minimal centrilobular emphysema.  Enlarged bilateral axillary lymph nodes  PFT: 08/03/21 FVC 2.65 (81%) FEV1 2.27 (88%) Ratio 84  TLC 78% DLCO 108% Interpretation: Mild restrictive defect. Normal DLCO. 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  PFTs reviewed with mild restrictive defect, normal gas exchange. Counseled on tobacco cessation as this is likely the main culprit of her cough. Will trial ICS/LABA for symptom relief. Discussed clinical course and management of emphysema including bronchodilator regimen and action plan for exacerbation.  Chronic cough --CONTINUE fluticasone 1  spray per nare nightly Buy over-the counter --STOP omeprazole. Ineffective  Hx emphysema --STOP Spiriva. Causes coughing  --START Breo 200. ONE puff ONCE a day. This is your EVERYDAY medication --CONTINUE Albuterol AS NEEDED  Tobacco abuse Patient is an active smoker. We discussed smoking cessation for 3 minutes. We discussed triggers and stressors and ways to deal with them. We discussed barriers to continued smoking and benefits of smoking cessation. Provided patient with information cessation techniques and interventions including [place] quitline.   ADDENDUM: 4:42 PM: Breo not covered. Advair 250 BID ordered  Health Maintenance Immunization History  Administered Date(s) Administered   Hepatitis A 05/04/2014, 07/23/2014   Hepatitis B 05/04/2014, 07/23/2014   PFIZER(Purple Top)SARS-COV-2 Vaccination 10/29/2019, 11/19/2019   Pneumococcal Conjugate-13 04/07/2020   Pneumococcal Polysaccharide-23 07/22/2020   Td 08/01/2005   Tdap 08/01/2005, 07/22/2020   CT Lung Screen- discuss at next visit  No orders of the defined types were placed in this encounter.  Meds ordered this encounter  Medications   fluticasone furoate-vilanterol (BREO ELLIPTA) 200-25 MCG/ACT AEPB    Sig: Inhale 1 puff into the lungs  daily.    Dispense:  60 each    Refill:  5    Return in about 3 months (around 11/03/2021).  I have spent a total time of 30-minutes on the day of the appointment reviewing prior documentation, coordinating care and discussing medical diagnosis and plan with the patient/family. Past medical history, allergies, medications were reviewed. Pertinent imaging, labs and tests included in this note have been reviewed and interpreted independently by me.  Maniyah Moller Mechele Collin, MD [facility] 08/03/2021 12:25 PM  Office Number [phone]

## 2021-08-23 ENCOUNTER — Other Ambulatory Visit: Payer: Self-pay | Admitting: Pulmonary Disease

## 2021-09-15 ENCOUNTER — Other Ambulatory Visit: Payer: Self-pay

## 2021-09-15 ENCOUNTER — Telehealth: Payer: Self-pay

## 2021-09-15 DIAGNOSIS — B20 Human immunodeficiency virus [HIV] disease: Secondary | ICD-10-CM

## 2021-09-15 MED ORDER — BIKTARVY 50-200-25 MG PO TABS: ORAL_TABLET | ORAL | 0 refills | Status: DC

## 2021-09-15 NOTE — Telephone Encounter (Signed)
Patient called requesting refill of Biktarvy.   Sandie Ano, RN

## 2021-09-25 ENCOUNTER — Encounter: Payer: Self-pay | Admitting: Infectious Diseases

## 2021-09-25 ENCOUNTER — Other Ambulatory Visit: Payer: Self-pay

## 2021-09-25 ENCOUNTER — Ambulatory Visit (INDEPENDENT_AMBULATORY_CARE_PROVIDER_SITE_OTHER): Payer: PRIVATE HEALTH INSURANCE | Admitting: Infectious Diseases

## 2021-09-25 VITALS — BP 143/91 | HR 96 | Temp 97.9°F | Ht 68.0 in | Wt 159.0 lb

## 2021-09-25 DIAGNOSIS — Z79899 Other long term (current) drug therapy: Secondary | ICD-10-CM | POA: Diagnosis not present

## 2021-09-25 DIAGNOSIS — B20 Human immunodeficiency virus [HIV] disease: Secondary | ICD-10-CM

## 2021-09-25 DIAGNOSIS — Z113 Encounter for screening for infections with a predominantly sexual mode of transmission: Secondary | ICD-10-CM | POA: Diagnosis not present

## 2021-09-25 MED ORDER — BIKTARVY 50-200-25 MG PO TABS: ORAL_TABLET | ORAL | 3 refills | Status: DC

## 2021-09-25 NOTE — Patient Instructions (Addendum)
Would love to see you back in 1 year for routine care. We can do blood work 2 weeks ahead of time so it is available when we chat.   Please continue your medication everyday - just separate it by 6 hours from your iron to make sure they both absorb well.   We can plan to do your pap smear at this appointment also (please ask for a 30 min appointment slot)  Would recommend the flu vaccine in the fall (sometime around October) if you want to get that.

## 2021-09-25 NOTE — Progress Notes (Signed)
Subjective:    Patient ID: Courtney Kim is a 56 y.o. female     DOB: 1965-11-03   MRN: [id number]    CC:  HIV follow up care - no concerns today.    HPI: Lump on her neck has completely resolved and she is thankful for this. No other health concerns today. She continues taking her biktarvy everyday without any concern for lapses in doses or side effects. Takes iron supplement too around the same time as other medications. Has a new inhaler to hep with breathing symptoms that she experiences around allergy season.   Questions about sexual health and risk to pass to partners. Not currently active, but considering.  Last pap smear was 7 years ago - never had any abnormal tests to her knowledge. Partial hysterectomy in the past.    Review of Systems  Constitutional:  Negative for chills and fever.  HENT:  Negative for tinnitus.   Eyes:  Negative for blurred vision and photophobia.  Respiratory:  Negative for cough and sputum production.   Cardiovascular:  Negative for chest pain.  Gastrointestinal:  Negative for diarrhea, nausea and vomiting.  Genitourinary:  Negative for dysuria.  Skin:  Negative for rash.  Neurological:  Negative for headaches.     Outpatient Medications Prior to Visit  Medication Sig Dispense Refill   albuterol (VENTOLIN HFA) 108 (90 Base) MCG/ACT inhaler TAKE 2 PUFFS BY MOUTH EVERY 6 HOURS AS NEEDED FOR WHEEZE OR SHORTNESS OF BREATH 8.5 each 2   aspirin EC 81 MG tablet Take 81 mg by mouth daily. Swallow whole.     ferrous sulfate 325 (65 FE) MG tablet Take 1 tablet (325 mg total) by mouth daily with breakfast. Please take with a source of Vitamin C 90 tablet 3   fluconazole (DIFLUCAN) 150 MG tablet Take 1 tablet (150 mg total) by mouth daily. -For your yeast infection, start the Diflucan (fluconazole)- Take one pill today (day 1). If you're still having symptoms in 3 days, take the second pill. 2 tablet 0   fluticasone-salmeterol (ADVAIR) 250-50 MCG/ACT AEPB  Inhale 1 puff into the lungs in the morning and at bedtime. 60 each 5   meclizine (ANTIVERT) 25 MG tablet Take 1 tablet (25 mg total) by mouth 3 (three) times daily as needed for dizziness. 30 tablet 0   omeprazole (PRILOSEC) 40 MG capsule Take 40 mg by mouth daily.     Tiotropium Bromide Monohydrate (SPIRIVA RESPIMAT) 2.5 MCG/ACT AERS Inhale 2 puffs into the lungs daily. 4 g 0   bictegravir-emtricitabine-tenofovir AF (BIKTARVY) 50-200-25 MG TABS tablet TAKE 1 TABLET BY MOUTH AT THE SAME TIME EVERY DAY WITH OR WITHOUT FOOD 90 tablet 0   sulfamethoxazole-trimethoprim (BACTRIM) 400-80 MG tablet Take 1 tablet by mouth daily. 30 tablet 5   No facility-administered medications prior to visit.    Past Medical History:  Diagnosis Date   HIV infection (HCC)    Hypertension     Social History   Socioeconomic History   Marital status: Single    Spouse name: Not on file   Number of children: Not on file   Years of education: Not on file   Highest education level: Not on file  Occupational History   Not on file  Tobacco Use   Smoking status: Some Days    Packs/day: 2.00    Years: 20.00    Total pack years: 40.00    Types: E-cigarettes, Cigarettes   Smokeless tobacco: Never   Tobacco comments:  States she vapes occasionally  Vaping Use   Vaping Use: Some days   Substances: Nicotine  Substance and Sexual Activity   Alcohol use: Never   Drug use: Never   Sexual activity: Not Currently    Partners: Male    Comment: declined  condoms  Other Topics Concern   Not on file  Social History Narrative   Leave alone   Smoke cigarette occasion   Social Determinants of Health   Financial Resource Strain: Not on file  Food Insecurity: Not on file  Transportation Needs: Not on file  Physical Activity: Not on file  Stress: Not on file  Social Connections: Not on file  Intimate Partner Violence: Not on file        Objective:    Today's Vitals   09/25/21 0855  BP: (!) 143/91   Pulse: 96  Temp: 97.9 F (36.6 C)  TempSrc: Oral  Weight: 159 lb (72.1 kg)  Height: 5\' 8"  (1.727 m)  PainSc: 0-No pain   Body mass index is 24.18 kg/m.   Physical Exam  LABS: Lab Results  Component Value Date   HIV1RNAQUANT 60 (H) 03/13/2021   HIV1RNAQUANT Not Detected 07/22/2020   HIV1RNAQUANT <20 04/07/2020    Lab Results  Component Value Date   CREATININE 1.21 (H) 03/13/2021   CREATININE 0.64 07/22/2020   CREATININE 1.16 (H) 10/23/2019    Lab Results  Component Value Date   ALT 7 03/13/2021   AST 14 03/13/2021   ALKPHOS 89 10/23/2019   BILITOT 0.4 03/13/2021    Lab Results  Component Value Date   WBC 5.0 03/13/2021   HGB 12.6 03/13/2021   HCT 38.5 03/13/2021   MCV 89.1 03/13/2021   PLT 287 03/13/2021    No results found for: "RPR"      Assessment & Plan:   Problem List Items Addressed This Visit       Unprioritized   HIV (human immunodeficiency virus infection) (HCC) - Primary    Very well controlled on once daily Biktarvy. No concerns with access or adherence to medication. They are tolerating the medication well without side effects. No drug interactions identified. Pertinent lab tests ordered today. No changes to insurance coverage.  No dental needs today.  No concern over anxious/depressed mood.  Sexual health and family planning discussed - U=U discussed and reassurance surrounding this.  Will need pap smear update at next OV.  Vaccines up to date - recommend flu vaccine in the fall if she would like to get this - traditionally has deferred.   Return in about 1 year (around 09/26/2022).        Relevant Medications   bictegravir-emtricitabine-tenofovir AF (BIKTARVY) 50-200-25 MG TABS tablet   Other Relevant Orders   Lipid panel   RPR   HIV-1 RNA quant-no reflex-bld   COMPLETE METABOLIC PANEL WITH GFR   CBC with Differential/Platelet   Urinalysis   T-helper cells (CD4) count   Lipid panel   Other Visit Diagnoses     Routine  screening for STI (sexually transmitted infection)       Relevant Orders   RPR   High risk medication use       Relevant Orders   Lipid panel      11/26/2022, MSN, NP-C [facility]  [email or web address] Pager: [phone] Office: [phone] [facility] Main Line: [phone]

## 2021-09-25 NOTE — Addendum Note (Signed)
Addended by: Tressa Busman T on: 09/25/2021 09:40 AM   Modules accepted: Orders

## 2021-09-25 NOTE — Assessment & Plan Note (Signed)
Very well controlled on once daily Biktarvy. No concerns with access or adherence to medication. They are tolerating the medication well without side effects. No drug interactions identified. Pertinent lab tests ordered today. No changes to insurance coverage.  No dental needs today.  No concern over anxious/depressed mood.  Sexual health and family planning discussed - U=U discussed and reassurance surrounding this.  Will need pap smear update at next OV.  Vaccines up to date - recommend flu vaccine in the fall if she would like to get this - traditionally has deferred.   Return in about 1 year (around 09/26/2022).

## 2021-09-25 NOTE — Addendum Note (Signed)
Addended by: Tressa Busman T on: 09/25/2021 09:38 AM   Modules accepted: Orders

## 2021-09-27 LAB — CBC WITH DIFFERENTIAL/PLATELET
Absolute Monocytes: 496 cells/uL (ref 200–950)
Basophils Absolute: 29 cells/uL (ref 0–200)
Basophils Relative: 0.7 %
Eosinophils Absolute: 80 cells/uL (ref 15–500)
Eosinophils Relative: 1.9 %
HCT: 35.9 % (ref 35.0–45.0)
Hemoglobin: 12.1 g/dL (ref 11.7–15.5)
Lymphs Abs: 1117 cells/uL (ref 850–3900)
MCH: 29.7 pg (ref 27.0–33.0)
MCHC: 33.7 g/dL (ref 32.0–36.0)
MCV: 88 fL (ref 80.0–100.0)
MPV: 10.6 fL (ref 7.5–12.5)
Monocytes Relative: 11.8 %
Neutro Abs: 2478 cells/uL (ref 1500–7800)
Neutrophils Relative %: 59 %
Platelets: 298 10*3/uL (ref 140–400)
RBC: 4.08 10*6/uL (ref 3.80–5.10)
RDW: 12.7 % (ref 11.0–15.0)
Total Lymphocyte: 26.6 %
WBC: 4.2 10*3/uL (ref 3.8–10.8)

## 2021-09-27 LAB — COMPLETE METABOLIC PANEL WITH GFR
AG Ratio: 1.1 (calc) (ref 1.0–2.5)
ALT: 10 U/L (ref 6–29)
AST: 14 U/L (ref 10–35)
Albumin: 4.3 g/dL (ref 3.6–5.1)
Alkaline phosphatase (APISO): 73 U/L (ref 37–153)
BUN: 10 mg/dL (ref 7–25)
CO2: 31 mmol/L (ref 20–32)
Calcium: 9.2 mg/dL (ref 8.6–10.4)
Chloride: 104 mmol/L (ref 98–110)
Creat: 0.82 mg/dL (ref 0.50–1.03)
Globulin: 3.9 g/dL (calc) — ABNORMAL HIGH (ref 1.9–3.7)
Glucose, Bld: 78 mg/dL (ref 65–99)
Potassium: 4 mmol/L (ref 3.5–5.3)
Sodium: 139 mmol/L (ref 135–146)
Total Bilirubin: 0.5 mg/dL (ref 0.2–1.2)
Total Protein: 8.2 g/dL — ABNORMAL HIGH (ref 6.1–8.1)
eGFR: 84 mL/min/{1.73_m2} (ref >=60)

## 2021-09-27 LAB — HIV-1 RNA QUANT-NO REFLEX-BLD
HIV 1 RNA Quant: <20 Copies/mL — ABNORMAL HIGH
HIV-1 RNA Quant, Log: <1.3 Log cps/mL — ABNORMAL HIGH

## 2021-09-27 LAB — LIPID PANEL
Cholesterol: 173 mg/dL (ref <200)
HDL: 60 mg/dL (ref >=50)
LDL Cholesterol (Calc): 94 mg/dL (calc)
Non-HDL Cholesterol (Calc): 113 mg/dL (calc) (ref <130)
Total CHOL/HDL Ratio: 2.9 (calc) (ref <5.0)
Triglycerides: 91 mg/dL (ref <150)

## 2021-09-27 LAB — T-HELPER CELLS (CD4) COUNT (NOT AT ARMC)
CD4 % Helper T Cell: 33 % (ref 33–65)
CD4 T Cell Abs: 311 /uL — ABNORMAL LOW (ref 400–1790)

## 2021-09-27 LAB — RPR: RPR Ser Ql: NONREACTIVE

## 2021-10-06 ENCOUNTER — Telehealth: Payer: Self-pay

## 2021-10-06 NOTE — Telephone Encounter (Signed)
-----   Message from Blanchard Kelch, NP sent at 10/06/2021 11:07 AM EDT ----- Please give Courtney Kim a call to let her know that her labs continue to look very good. Her viral load is undetectable. Her immune system levels look about the same as last check - this is expected to find a "new normal" level as her body continues to recover. Her TCell count is 311. Very happy with her progress. No changes to the plan. See her in 1 year!

## 2021-10-06 NOTE — Telephone Encounter (Signed)
Patient aware of results and verbalized her understanding.   Courtney Kim P Lurleen Soltero, CMA  

## 2021-10-06 NOTE — Progress Notes (Signed)
Please give Courtney Kim a call to let her know that her labs continue to look very good. Her viral load is undetectable. Her immune system levels look about the same as last check - this is expected to find a "new normal" level as her body continues to recover. Her TCell count is 311. Very happy with her progress. No changes to the plan. See her in 1 year!

## 2021-11-08 ENCOUNTER — Encounter: Payer: Self-pay | Admitting: Pulmonary Disease

## 2021-11-08 ENCOUNTER — Ambulatory Visit (INDEPENDENT_AMBULATORY_CARE_PROVIDER_SITE_OTHER): Payer: PRIVATE HEALTH INSURANCE | Admitting: Pulmonary Disease

## 2021-11-08 VITALS — BP 138/90 | HR 84 | Ht 68.0 in | Wt 159.4 lb

## 2021-11-08 DIAGNOSIS — R053 Chronic cough: Secondary | ICD-10-CM

## 2021-11-08 MED ORDER — BUDESONIDE-FORMOTEROL FUMARATE 160-4.5 MCG/ACT IN AERO: 2.0000 | INHALATION_SPRAY | Freq: Two times a day (BID) | RESPIRATORY_TRACT | 5 refills | Status: AC

## 2021-11-08 MED ORDER — SPACER/AERO-HOLD CHAMBER BAGS MISC: 1.0000 | Freq: Every day | 0 refills | Status: AC | PRN

## 2021-11-08 NOTE — Progress Notes (Signed)
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  Synopsis: Initially referred by Dr. Thornell Mule at [facility] for chronic cough. Note from 05/19/21 reviewed from PCP. Chronic cough treated with omeprazole, tessalon perles. Advised to stop vaping. Referred to Pulmonary  She reports long standing cough for at least a year. Denies preceding illness. She reports the cough has progressively worsened and now interrupts her sleep. Coffee, laying down, strong odors, allergens seems to worsen her cough. Associated with shortness of breath and wheezing. She currently vapes twice a day and occasional smokes cigarettes. Reports nasal congestion and not taking anything. Denies known reflux and intermittently taking it. Denies asthma and denies recurrent respiratory illness.   08/03/21 Since our last visit she continues to have chronic nonproductive cough that is worse at night. Associated with sometimes wheeze and shortness of breath. Still vaping 2-3 times a day. Has been compliant flonase and reflux meds without any changes in cough. Spiriva did not seem to help and aggravated the cough.  11/08/21 Since our last visit, she is compliant with Breo but reports throat irritation that will provoke a cough. Even without inhalers she has this sensation. States inhaler provides no difference. Still has nocturnal cough at night. Sometimes wheezing and  shortness of breath. Still vaping. No longer smoking.  Social History: Quit smoking in August 2023.  Vapes. 40 pack-years Started when she was smoking at 56 years old. At peak 2ppd.   Past Medical History:  Diagnosis  Date   HIV infection (HCC)    Hypertension      Family History  Problem Relation Age of Onset   Hypertension Mother    Sickle cell anemia Father    Breast cancer Paternal Grandmother      Social History   Occupational History   Not on file  Tobacco Use   Smoking status: Some Days    Packs/day: 2.00    Years: 20.00    Total pack years: 40.00    Types: E-cigarettes, Cigarettes   Smokeless tobacco: Current   Tobacco comments:    States she vapes daily  Vaping Use   Vaping Use: Some days   Substances: Nicotine  Substance and Sexual Activity   Alcohol use: Never   Drug use: Never   Sexual activity: Not Currently    Partners: Male    Comment: declined  condoms    Allergies  Allergen Reactions   Hydrocodone-Acetaminophen Other (See Comments)     Outpatient Medications Prior to Visit  Medication Sig Dispense Refill   albuterol (VENTOLIN HFA) 108 (90 Base) MCG/ACT inhaler TAKE 2 PUFFS BY MOUTH EVERY 6 HOURS AS NEEDED FOR WHEEZE OR SHORTNESS OF BREATH 8.5 each 2   aspirin EC 81 MG tablet Take 81 mg by mouth daily. Swallow whole.     bictegravir-emtricitabine-tenofovir AF (BIKTARVY) 50-200-25 MG TABS tablet TAKE 1 TABLET BY MOUTH AT THE SAME TIME EVERY DAY WITH OR WITHOUT FOOD 90 tablet 3   ferrous sulfate 325 (65 FE) MG tablet Take 1 tablet (325 mg total) by mouth daily with breakfast. Please take with a source of Vitamin C 90 tablet 3   fluconazole (DIFLUCAN)  150 MG tablet Take 1 tablet (150 mg total) by mouth daily. -For your yeast infection, start the Diflucan (fluconazole)- Take one pill today (day 1). If you're still having symptoms in 3 days, take the second pill. 2 tablet 0   fluticasone-salmeterol (ADVAIR) 250-50 MCG/ACT AEPB Inhale 1 puff into the lungs in the morning and at bedtime. 60 each 5   meclizine (ANTIVERT) 25 MG tablet Take 1 tablet (25 mg total) by mouth 3 (three) times daily as needed for dizziness. 30 tablet 0   omeprazole (PRILOSEC) 40 MG capsule Take 40  mg by mouth daily.     Tiotropium Bromide Monohydrate (SPIRIVA RESPIMAT) 2.5 MCG/ACT AERS Inhale 2 puffs into the lungs daily. 4 g 0   No facility-administered medications prior to visit.    Review of Systems  Constitutional:  Negative for chills, diaphoresis, fever, malaise/fatigue and weight loss.  HENT:  Negative for congestion.   Respiratory:  Positive for cough, shortness of breath and wheezing. Negative for hemoptysis and sputum production.   Cardiovascular:  Negative for chest pain, palpitations and leg swelling.     Objective:   Vitals:   11/08/21 1002  BP: (!) 138/90  Pulse: 84  SpO2: 100%  Weight: 159 lb 6.4 oz (72.3 kg)  Height: 5\' 8"  (1.727 m)  SpO2: 100 % O2 Device: None (Room air)  Physical Exam: General: Well-appearing, no acute distress HENT: [place], AT Eyes: EOMI, no scleral icterus Respiratory: Clear to auscultation bilaterally.  No crackles, wheezing or rales Cardiovascular: RRR, -M/R/G, no JVD Extremities:-Edema,-tenderness Neuro: AAO x4, CNII-XII grossly intact Psych: Normal mood, normal affect  Data Reviewed:  Imaging: CT chest HR 01/09/2021-minimal centrilobular emphysema.  Enlarged bilateral axillary lymph nodes  PFT: 08/03/21 FVC 2.65 (81%) FEV1 2.27 (88%) Ratio 84  TLC 78% DLCO 108% Interpretation: Mild restrictive defect. Normal DLCO. 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 Counseled on vaping cessation. Discussed clinical course and management of COPD including bronchodilator regimen and action plan for exacerbation.  Failed Spiriva (cough) and Breo (ineffective)  Chronic cough --PFT 08/03/21 mild restrictive defect, normal DLCO --ORDER CT Chest without contrast --CONTINUE  fluticasone 1 spray per nare nightly Buy over-the counter --Dc'd omeprazole. Ineffective  Hx emphysema --STOP Breo --START Symbicort 160-4.5 mcg TWO puffs in the morning and evening. This is your EVERYDAY medication --CONTINUE Albuterol AS NEEDED  Tobacco abuse Patient is an active smoker. We discussed smoking cessation for 3 minutes. We discussed triggers and stressors and ways to deal with them. We discussed barriers to continued smoking and benefits of smoking cessation. Provided patient with information cessation techniques and interventions including [place] quitline.  Health Maintenance Immunization History  Administered Date(s) Administered   Hepatitis A 05/04/2014, 07/23/2014   Hepatitis B 05/04/2014, 07/23/2014   PFIZER(Purple Top)SARS-COV-2 Vaccination 10/29/2019, 11/19/2019   Pneumococcal Conjugate-13 04/07/2020   Pneumococcal Polysaccharide-23 07/22/2020   Td 08/01/2005   Tdap 08/01/2005, 07/22/2020   CT Lung Screen- discuss at next visit  Orders Placed This Encounter  Procedures   CT Chest Wo Contrast    Wt:159 No spinal Stimulator/ No body injector/ No glucose or heart monitor No special needs 09/21/2020 health MCD S/w Sherri @ [facility]  [phone]  epic  order/LM Pt aware of $75  of no show fee    Standing Status:   Future    Standing Expiration Date:   11/09/2022    Order Specific Question:   Is patient pregnant?    Answer:   No    Order Specific Question:   Preferred imaging location?    Answer:   [id number]   Meds ordered this encounter  Medications   budesonide-formoterol (SYMBICORT) 160-4.5 MCG/ACT inhaler    Sig: Inhale 2 puffs into the lungs in the morning and at bedtime.    Dispense:  1 each    Refill:  5   Spacer/Aero-Hold Chamber Bags MISC    Sig: 1 each by Does not apply route daily as needed.    Dispense:  1 each    Refill:  0    Return in about 4 months (around 03/10/2022).  I have spent a total time of 31-minutes on the day of the  appointment including chart review, data review, collecting history, coordinating care and discussing medical diagnosis and plan with the patient/family. Past medical history, allergies, medications were reviewed. Pertinent imaging, labs and tests included in this note have been reviewed and interpreted independently by me.  Bertram, MD [facility] 11/08/2021 10:17 AM  Office Number [phone]

## 2021-11-08 NOTE — Patient Instructions (Addendum)
Chronic cough --ORDER CT Chest without contrast --CONTINUE fluticasone 1 spray per nare nightly Buy over-the counter --Dc'd omeprazole. Ineffective  Hx emphysema --STOP Breo --START Symbicort 160-4.5 mcg TWO puffs in the morning and evening. This is your EVERYDAY medication --Use inhaler with spacer --CONTINUE Albuterol AS NEEDED  Follow-up with me in 4 months

## 2021-11-28 ENCOUNTER — Other Ambulatory Visit: Payer: Self-pay | Admitting: Pulmonary Disease

## 2021-11-30 ENCOUNTER — Other Ambulatory Visit: Payer: PRIVATE HEALTH INSURANCE

## 2022-01-30 ENCOUNTER — Other Ambulatory Visit: Payer: Self-pay

## 2022-01-30 DIAGNOSIS — B20 Human immunodeficiency virus [HIV] disease: Secondary | ICD-10-CM

## 2022-01-30 MED ORDER — BIKTARVY 50-200-25 MG PO TABS: ORAL_TABLET | ORAL | 1 refills | Status: DC

## 2022-03-25 ENCOUNTER — Emergency Department (HOSPITAL_COMMUNITY): Payer: PRIVATE HEALTH INSURANCE

## 2022-03-25 ENCOUNTER — Emergency Department (HOSPITAL_COMMUNITY)
Admission: EM | Admit: 2022-03-25 | Discharge: 2022-03-25 | Disposition: A | Discharge status: Home / Self Care | Payer: PRIVATE HEALTH INSURANCE | Attending: Emergency Medicine | Admitting: Emergency Medicine

## 2022-03-25 ENCOUNTER — Other Ambulatory Visit: Payer: Self-pay

## 2022-03-25 DIAGNOSIS — E876 Hypokalemia: Secondary | ICD-10-CM | POA: Diagnosis not present

## 2022-03-25 DIAGNOSIS — M549 Dorsalgia, unspecified: Secondary | ICD-10-CM

## 2022-03-25 DIAGNOSIS — R531 Weakness: Secondary | ICD-10-CM | POA: Diagnosis present

## 2022-03-25 LAB — CBC
HCT: 37.9 % (ref 36.0–46.0)
Hemoglobin: 12.3 g/dL (ref 12.0–15.0)
MCH: 28.9 pg (ref 26.0–34.0)
MCHC: 32.5 g/dL (ref 30.0–36.0)
MCV: 89 fL (ref 80.0–100.0)
Platelets: 307 10*3/uL (ref 150–400)
RBC: 4.26 MIL/uL (ref 3.87–5.11)
RDW: 13.5 % (ref 11.5–15.5)
WBC: 2.9 10*3/uL — ABNORMAL LOW (ref 4.0–10.5)
nRBC: 0 % (ref 0.0–0.2)

## 2022-03-25 LAB — BASIC METABOLIC PANEL
Anion gap: 9 (ref 5–15)
BUN: 10 mg/dL (ref 6–20)
CO2: 25 mmol/L (ref 22–32)
Calcium: 9.3 mg/dL (ref 8.9–10.3)
Chloride: 103 mmol/L (ref 98–111)
Creatinine, Ser: 0.66 mg/dL (ref 0.44–1.00)
GFR, Estimated: >60 mL/min (ref >60)
Glucose, Bld: 110 mg/dL — ABNORMAL HIGH (ref 70–99)
Potassium: 3.3 mmol/L — ABNORMAL LOW (ref 3.5–5.1)
Sodium: 137 mmol/L (ref 135–145)

## 2022-03-25 LAB — URINALYSIS, ROUTINE W REFLEX MICROSCOPIC
Bilirubin Urine: NEGATIVE
Glucose, UA: NEGATIVE mg/dL
Hgb urine dipstick: NEGATIVE
Ketones, ur: 5 mg/dL — AB
Nitrite: NEGATIVE
Protein, ur: NEGATIVE mg/dL
Specific Gravity, Urine: 1.017 (ref 1.005–1.030)
pH: 5 (ref 5.0–8.0)

## 2022-03-25 MED ORDER — POTASSIUM CHLORIDE CRYS ER 20 MEQ PO TBCR
40.0000 meq | EXTENDED_RELEASE_TABLET | Freq: Once | ORAL | Status: AC
  Administered 2022-03-25: 40 meq via ORAL
  Filled 2022-03-25: qty 2

## 2022-03-25 MED ORDER — LACTATED RINGERS IV BOLUS
500.0000 mL | Freq: Once | INTRAVENOUS | Status: AC
  Administered 2022-03-25: 500 mL via INTRAVENOUS

## 2022-03-25 MED ORDER — POTASSIUM CHLORIDE CRYS ER 20 MEQ PO TBCR: 20.0000 meq | EXTENDED_RELEASE_TABLET | Freq: Two times a day (BID) | ORAL | 0 refills | Status: DC

## 2022-03-25 NOTE — ED Triage Notes (Signed)
C/o weakness with bodyaches x1 day.  Denies fever/chills

## 2022-03-25 NOTE — ED Provider Notes (Signed)
[place] [facility] DE[facility] AT [facility] Provider Note   CSN: [id number] Arrival date & time: 03/25/22  6606     History  Chief Complaint  Patient presents with   Weakness    Katoria Yetman is a 57 y.o. female.  HPI 57 year old female complaining of generalized weakness.  She states that she became generally weak on awakening yesterday.  She has not had fever or chills.  She has had some ongoing cough.  Denies any chest pain or abdominal pain.  She has some mid upper back pain that is worse with movement.  She denies any recent strains or falls.  She denies any UTI symptoms.    Home Medications Prior to Admission medications   Medication Sig Start Date End Date Taking? Authorizing Provider  potassium chloride SA (KLOR-CON M) 20 MEQ tablet Take 1 tablet (20 mEq total) by mouth 2 (two) times daily. 03/25/22  Yes Pattricia Boss, MD  albuterol (VENTOLIN HFA) 108 (90 Base) MCG/ACT inhaler TAKE 2 PUFFS BY MOUTH EVERY 6 HOURS AS NEEDED FOR WHEEZE OR SHORTNESS OF BREATH 11/28/21   Margaretha Seeds, MD  aspirin EC 81 MG tablet Take 81 mg by mouth daily. Swallow whole.    [provider]  bictegravir-emtricitabine-tenofovir AF (BIKTARVY) 50-200-25 MG TABS tablet TAKE 1 TABLET BY MOUTH AT THE SAME TIME EVERY DAY WITH OR WITHOUT FOOD 01/30/22   [place] Callas, NP  budesonide-formoterol (SYMBICORT) 160-4.5 MCG/ACT inhaler Inhale 2 puffs into the lungs in the morning and at bedtime. 11/08/21   Margaretha Seeds, MD  ferrous sulfate 325 (65 FE) MG tablet Take 1 tablet (325 mg total) by mouth daily with breakfast. Please take with a source of Vitamin C 07/23/19   Orson Slick, MD  fluconazole (DIFLUCAN) 150 MG tablet Take 1 tablet (150 mg total) by mouth daily. -For your yeast infection, start the Diflucan (fluconazole)- Take one pill today (day 1). If you're still having symptoms in 3 days, take the second pill. 07/13/21   Hazel Sams, PA-C  meclizine (ANTIVERT) 25 MG  tablet Take 1 tablet (25 mg total) by mouth 3 (three) times daily as needed for dizziness. 07/22/20   [place] Callas, NP  omeprazole (PRILOSEC) 40 MG capsule Take 40 mg by mouth daily. 05/19/21   [provider]  Spacer/Aero-Hold Chamber Bags MISC 1 each by Does not apply route daily as needed. 11/08/21   Margaretha Seeds, MD  Tiotropium Bromide Monohydrate (SPIRIVA RESPIMAT) 2.5 MCG/ACT AERS Inhale 2 puffs into the lungs daily. 06/13/21   Margaretha Seeds, MD      Allergies    Hydrocodone-acetaminophen    Review of Systems   Review of Systems  Physical Exam Updated Vital Signs BP (!) 155/105   Pulse 83   Temp 98.3 F (36.8 C) (Oral)   Resp 20   Wt 72 kg   SpO2 98%   BMI 24.14 kg/m  Physical Exam Vitals and nursing note reviewed.  Constitutional:      Appearance: Normal appearance.  HENT:     Head: Normocephalic and atraumatic.     Right Ear: External ear normal.     Left Ear: External ear normal.     Nose: Nose normal.     Mouth/Throat:     Pharynx: Oropharynx is clear.  Eyes:     Pupils: Pupils are equal, round, and reactive to light.  Cardiovascular:     Rate and Rhythm: Normal rate and regular rhythm.  Pulses: Normal pulses.  Pulmonary:     Effort: Pulmonary effort is normal.     Breath sounds: Normal breath sounds.  Abdominal:     General: Abdomen is flat. Bowel sounds are normal.  Musculoskeletal:        General: Normal range of motion.     Cervical back: Normal range of motion.  Skin:    General: Skin is warm and dry.     Capillary Refill: Capillary refill takes less than 2 seconds.  Neurological:     General: No focal deficit present.     Mental Status: She is alert.  Psychiatric:        Mood and Affect: Mood normal.     ED Results / Procedures / Treatments   Labs (all labs ordered are listed, but only abnormal results are displayed) Labs Reviewed  CBC - Abnormal; Notable for the following components:      Result Value   WBC 2.9 (*)     All other components within normal limits  BASIC METABOLIC PANEL - Abnormal; Notable for the following components:   Potassium 3.3 (*)    Glucose, Bld 110 (*)    All other components within normal limits  URINALYSIS, ROUTINE W REFLEX MICROSCOPIC - Abnormal; Notable for the following components:   APPearance HAZY (*)    Ketones, ur 5 (*)    Leukocytes,Ua TRACE (*)    Bacteria, UA RARE (*)    All other components within normal limits    EKG None  Radiology DG Thoracic Spine 2 View  Result Date: 03/25/2022 CLINICAL DATA:  Weakness and body aches for 1 day. Upper back pain for 2 weeks. EXAM: THORACIC SPINE 2 VIEWS COMPARISON:  Chest CT 01/09/2021 FINDINGS: Maintenance of vertebral body height. Intervertebral disc heights are maintained. Minimal S shaped thoracic spine curvature. No paravertebral abnormality. IMPRESSION: No acute osseous abnormality. Electronically Signed   By: Abigail Miyamoto M.D.   On: 03/25/2022 09:30   DG Chest Port 1 View  Result Date: 03/25/2022 CLINICAL DATA:  Back pain EXAM: PORTABLE CHEST 1 VIEW COMPARISON:  08/19/2019 FINDINGS: Normal heart size and mediastinal contours. No acute infiltrate or edema. No effusion or pneumothorax. No acute osseous findings. Artifact from EKG leads. IMPRESSION: No active disease. Electronically Signed   By: Jorje Guild M.D.   On: 03/25/2022 09:26    Procedures Procedures    Medications Ordered in ED Medications  lactated ringers bolus 500 mL (0 mLs Intravenous Stopped 03/25/22 1017)  potassium chloride SA (KLOR-CON M) CR tablet 40 mEq (40 mEq Oral Given 03/25/22 1052)    ED Course/ Medical Decision Making/ A&P Clinical Course as of 03/25/22 1625  Sun Mar 25, 2022  1051 Chest x-Djimon Lundstrom reviewed interpreted no evidence of acute disease noted on my interpretation radiologist or potation concurs [DR]  2952 Thoracic spines reviewed interpreted no acute osseous abnormality [DR]    Clinical Course User Index [DR] Pattricia Boss, MD                              Medical Decision Making Amount and/or Complexity of Data Reviewed Labs: ordered. Radiology: ordered.  Risk Prescription drug management.  57 year old female who presents today complaining of some generalized lysed weakness.  Differential diagnosis is broad and includes but is not limited to 1 infection 2 neurological event 3 metabolic abnormalities including hypoglycemia and other electrolyte abnormalities Patient is evaluated here in the ED with labs including  a basic metabolic panel with potassium slightly decreased at 3.3 otherwise electrolytes and kidney function appear to be normal Patient's evaluated with CBC which is significant for a mild leukopenia at 2.9 otherwise within normal limits Urinalysis obtained and shows 6-10 squamous epithelial cells 0-5 red blood cells 0-5 white blood cells and trace leukocytes.  This is unlikely to represent infection and most likely is contaminated specimen X-Kathlyne Loud was reviewed and no evidence of acute infiltrate is noted She is given 500 cc of fluid and oral potassium replenishment Blood pressure is elevated here She is maintained on the monitor with a normal heart rate She is advised regarding monitoring her blood pressure taking her medications and having this rechecked She will be given several doses of potassium and is advised regarding having this rechecked She is advised regarding return precautions  Discharge diagnosis weakness Mild hypokalemia Mild leukopenia Plan oral potassium and return precautions with close follow-up with primary care next week        Final Clinical Impression(s) / ED Diagnoses Final diagnoses:  Generalized weakness  Hypokalemia  Upper back pain    Rx / DC Orders ED Discharge Orders          Ordered    potassium chloride SA (KLOR-CON M) 20 MEQ tablet  2 times daily        03/25/22 1056              Pattricia Boss, MD 03/25/22 1625

## 2022-03-25 NOTE — Discharge Instructions (Signed)
On your evaluation here you had labs checked.  Your potassium is found to be slightly low and you will be given some oral repletion Back pain appears to be musculoskeletal and x-rays did not show any evidence of fracture. Your white blood cell count is slightly low here today Please take potassium and follow-up with your primary care early next week Return to the emergency department if you are having worsening symptoms especially increased weakness, fever, chills or other new symptoms

## 2022-03-27 ENCOUNTER — Ambulatory Visit (HOSPITAL_BASED_OUTPATIENT_CLINIC_OR_DEPARTMENT_OTHER): Payer: PRIVATE HEALTH INSURANCE | Admitting: Pulmonary Disease

## 2022-04-17 ENCOUNTER — Other Ambulatory Visit: Payer: Self-pay

## 2022-04-17 DIAGNOSIS — B20 Human immunodeficiency virus [HIV] disease: Secondary | ICD-10-CM

## 2022-04-17 MED ORDER — BIKTARVY 50-200-25 MG PO TABS: ORAL_TABLET | ORAL | 6 refills | Status: DC

## 2022-04-19 ENCOUNTER — Telehealth: Payer: Self-pay

## 2022-04-19 NOTE — Telephone Encounter (Signed)
Spoke with Courtney Kim, she says [facility] did not have a prescription for her Biktarvy yesterday. Rx was sent in on 2/27.  Called [facility], they have the Rx on file and it should be ready by 1:15 this afternoon. Relayed this to [employer].   Beryle Flock, RN

## 2022-04-19 NOTE — Telephone Encounter (Signed)
-----   Message from Eugenia Mcalpine, LPN sent at [id number] 12:02 PM EST ----- Regarding: call back request Patient requesting a call back

## 2022-04-20 ENCOUNTER — Ambulatory Visit (HOSPITAL_BASED_OUTPATIENT_CLINIC_OR_DEPARTMENT_OTHER): Payer: PRIVATE HEALTH INSURANCE | Admitting: Pulmonary Disease

## 2022-04-23 ENCOUNTER — Other Ambulatory Visit (HOSPITAL_COMMUNITY): Payer: Self-pay

## 2022-04-23 NOTE — Telephone Encounter (Signed)
Patient states she was not able to get her medication last week due to insurance issues. To avoid in lapse in medication she would like to come by the office to pick up samples of Biktarvy. Routing to pharmacy to make aware.  Eugenia Mcalpine, LPN

## 2022-04-23 NOTE — Telephone Encounter (Signed)
Ok , I will reach out to [facility] as well because she have UMAP but I am finding [employer] for her as well.

## 2022-04-25 ENCOUNTER — Telehealth: Payer: Self-pay | Admitting: Pharmacist

## 2022-04-25 DIAGNOSIS — B20 Human immunodeficiency virus [HIV] disease: Secondary | ICD-10-CM

## 2022-04-25 MED ORDER — BICTEGRAVIR-EMTRICITAB-TENOFOV 50-200-25 MG PO TABS: 1.0000 | ORAL_TABLET | Freq: Every day | ORAL | 0 refills | Status: AC

## 2022-04-25 NOTE — Telephone Encounter (Signed)
Medication Samples have been provided to the patient.  Drug name: Biktarvy        Strength: 50/200/25 mg       Qty: 7 tablets (1 bottles) LOT: CPBDCA   Exp.Date: 3/26  Dosing instructions: Take one tablet by mouth once daily  The patient has been instructed regarding the correct time, dose, and frequency of taking this medication, including desired effects and most common side effects.   Alfonse Spruce, PharmD, CPP, BCIDP, [place] Clinical Pharmacist Practitioner [facility]ious [facility]

## 2022-06-07 ENCOUNTER — Ambulatory Visit (HOSPITAL_BASED_OUTPATIENT_CLINIC_OR_DEPARTMENT_OTHER): Payer: PRIVATE HEALTH INSURANCE | Admitting: Pulmonary Disease

## 2022-06-13 ENCOUNTER — Other Ambulatory Visit (HOSPITAL_COMMUNITY): Payer: Self-pay

## 2022-07-02 ENCOUNTER — Other Ambulatory Visit: Payer: Self-pay | Admitting: Infectious Diseases

## 2022-07-02 DIAGNOSIS — B20 Human immunodeficiency virus [HIV] disease: Secondary | ICD-10-CM

## 2022-07-11 ENCOUNTER — Emergency Department (HOSPITAL_COMMUNITY)
Admission: EM | Admit: 2022-07-11 | Discharge: 2022-07-11 | Disposition: A | Discharge status: Home / Self Care | Payer: Medicaid Other | Attending: Emergency Medicine | Admitting: Emergency Medicine

## 2022-07-11 ENCOUNTER — Other Ambulatory Visit: Payer: Self-pay

## 2022-07-11 ENCOUNTER — Encounter (HOSPITAL_COMMUNITY): Payer: Self-pay

## 2022-07-11 DIAGNOSIS — K047 Periapical abscess without sinus: Secondary | ICD-10-CM | POA: Diagnosis not present

## 2022-07-11 DIAGNOSIS — Z21 Asymptomatic human immunodeficiency virus [HIV] infection status: Secondary | ICD-10-CM | POA: Insufficient documentation

## 2022-07-11 DIAGNOSIS — Z7982 Long term (current) use of aspirin: Secondary | ICD-10-CM | POA: Diagnosis not present

## 2022-07-11 DIAGNOSIS — K0889 Other specified disorders of teeth and supporting structures: Secondary | ICD-10-CM | POA: Diagnosis present

## 2022-07-11 MED ORDER — NAPROXEN 500 MG PO TABS: 500.0000 mg | ORAL_TABLET | Freq: Two times a day (BID) | ORAL | 0 refills | Status: DC

## 2022-07-11 MED ORDER — AMOXICILLIN 500 MG PO CAPS: 500.0000 mg | ORAL_CAPSULE | Freq: Two times a day (BID) | ORAL | 0 refills | Status: AC

## 2022-07-11 NOTE — ED Triage Notes (Addendum)
C/o right sided jaw swelling x 2 days. Pt reports filling fell off to right side.

## 2022-07-11 NOTE — ED Provider Notes (Signed)
[place] EMERGENCY DEPARTMENT AT [facility] Provider Note   CSN: [id number] Arrival date & time: 07/11/22  0957     History  Chief Complaint  Patient presents with   Abscess   Dental Pain    Courtney Kim is a 57 y.o. female history of HIV.  Followed by ID here for evaluation of abscess.  She noted some dental pain to her right posterior dentition after her crown fell off earlier in the week.  She is subsequently has had some right-sided facial swelling.  Does not extend into her submandibular region.  No drooling, dysphagia or trismus.  No fever.  Tolerating p.o. intake.  HPI     Home Medications Prior to Admission medications   Medication Sig Start Date End Date Taking? Authorizing Provider  amoxicillin (AMOXIL) 500 MG capsule Take 1 capsule (500 mg total) by mouth 2 (two) times daily for 7 days. 07/11/22 07/18/22 Yes Romey Mathieson A, PA-C  naproxen (NAPROSYN) 500 MG tablet Take 1 tablet (500 mg total) by mouth 2 (two) times daily. 07/11/22  Yes Sheva Mcdougle A, PA-C  albuterol (VENTOLIN HFA) 108 (90 Base) MCG/ACT inhaler TAKE 2 PUFFS BY MOUTH EVERY 6 HOURS AS NEEDED FOR WHEEZE OR SHORTNESS OF BREATH 11/28/21   Luciano Cutter, MD  aspirin EC 81 MG tablet Take 81 mg by mouth daily. Swallow whole.    [provider]  bictegravir-emtricitabine-tenofovir AF (BIKTARVY) 50-200-25 MG TABS tablet TAKE 1 TABLET BY MOUTH AT THE SAME TIME EVERY DAY WITH OR WITHOUT FOOD 04/17/22   Blanchard Kelch, NP  budesonide-formoterol (SYMBICORT) 160-4.5 MCG/ACT inhaler Inhale 2 puffs into the lungs in the morning and at bedtime. 11/08/21   Luciano Cutter, MD  ferrous sulfate 325 (65 FE) MG tablet Take 1 tablet (325 mg total) by mouth daily with breakfast. Please take with a source of Vitamin C 07/23/19   Jaci Standard, MD  fluconazole (DIFLUCAN) 150 MG tablet Take 1 tablet (150 mg total) by mouth daily. -For your yeast infection, start the Diflucan (fluconazole)- Take  one pill today (day 1). If you're still having symptoms in 3 days, take the second pill. 07/13/21   Rhys Martini, PA-C  meclizine (ANTIVERT) 25 MG tablet Take 1 tablet (25 mg total) by mouth 3 (three) times daily as needed for dizziness. 07/22/20   Blanchard Kelch, NP  omeprazole (PRILOSEC) 40 MG capsule Take 40 mg by mouth daily. 05/19/21   [provider]  potassium chloride SA (KLOR-CON M) 20 MEQ tablet Take 1 tablet (20 mEq total) by mouth 2 (two) times daily. 03/25/22   Margarita Grizzle, MD  Spacer/Aero-Hold Chamber Bags MISC 1 each by Does not apply route daily as needed. 11/08/21   Luciano Cutter, MD  Tiotropium Bromide Monohydrate (SPIRIVA RESPIMAT) 2.5 MCG/ACT AERS Inhale 2 puffs into the lungs daily. 06/13/21   Luciano Cutter, MD      Allergies    Hydrocodone-acetaminophen    Review of Systems   Review of Systems  Constitutional: Negative.   HENT:  Positive for dental problem and facial swelling.   Respiratory: Negative.    Cardiovascular: Negative.   Gastrointestinal: Negative.   Genitourinary: Negative.   Musculoskeletal: Negative.   Skin: Negative.   Neurological: Negative.   All other systems reviewed and are negative.   Physical Exam Updated Vital Signs BP (!) 152/89 (BP Location: Right Arm)   Pulse 99   Temp 97.9 F (36.6 C) (Oral)  Resp 17   Wt 72 kg   SpO2 99%   BMI 24.14 kg/m  Physical Exam Vitals and nursing note reviewed.  Constitutional:      General: She is not in acute distress.    Appearance: She is well-developed. She is not ill-appearing, toxic-appearing or diaphoretic.  HENT:     Head: Normocephalic and atraumatic.     Jaw: There is normal jaw occlusion. Swelling present.      Comments: No drooling, dysphagia or trismus.  Right lateral mandibular swelling does not extend into the submandibular region no fluctuance or induration.    Mouth/Throat:     Mouth: Mucous membranes are moist.     Dentition: Gingival swelling and dental  caries present.     Pharynx: Oropharynx is clear. Uvula midline. No pharyngeal swelling, oropharyngeal exudate, posterior oropharyngeal erythema or uvula swelling.      Comments: Sublingual area soft, tongue midline.  No drooling, dysphagia or trismus.  Fullness right bucal fold at periapical region consistent with abscess Eyes:     Pupils: Pupils are equal, round, and reactive to light.  Cardiovascular:     Rate and Rhythm: Normal rate.  Pulmonary:     Effort: No respiratory distress.  Abdominal:     General: There is no distension.  Musculoskeletal:        General: Normal range of motion.     Cervical back: Normal range of motion.  Skin:    General: Skin is warm and dry.  Neurological:     General: No focal deficit present.     Mental Status: She is alert.  Psychiatric:        Mood and Affect: Mood normal.     ED Results / Procedures / Treatments   Labs (all labs ordered are listed, but only abnormal results are displayed) Labs Reviewed - No data to display  EKG None  Radiology No results found.  Procedures .Marland KitchenIncision and Drainage  Date/Time: 07/11/2022 11:29 AM  Performed by: Linwood Dibbles, PA-C Authorized by: Linwood Dibbles, PA-C   Consent:    Consent obtained:  Verbal   Consent given by:  Patient   Risks discussed:  Bleeding, incomplete drainage, pain, damage to other organs and infection   Alternatives discussed:  No treatment, delayed treatment, alternative treatment, observation and referral Universal protocol:    Procedure explained and questions answered to patient or proxy's satisfaction: yes     Relevant documents present and verified: yes     Test results available : yes     Imaging studies available: yes     Required blood products, implants, devices, and special equipment available: yes     Site/side marked: yes     Immediately prior to procedure, a time out was called: yes     Patient identity confirmed:  Verbally with patient Location:     Type:  Abscess   Location:  Mouth   Mouth location:  Alveolar process (bucal fold) Anesthesia:    Anesthesia method:  Topical application   Topical anesthesia: lidocaine topical. Procedure type:    Complexity:  Complex Procedure details:    Incision types:  Single straight   Incision depth:  Subcutaneous   Wound management:  Probed and deloculated, irrigated with saline and extensive cleaning   Drainage:  Purulent   Drainage amount:  Moderate   Wound treatment:  Wound left open   Packing materials:  None Post-procedure details:    Procedure completion:  Tolerated well, no immediate complications  Medications Ordered in ED Medications - No data to display  ED Course/ Medical Decision Making/ A&P   57 year old history of HIV on Biktarvy here for evaluation of dental pain and facial swelling which began earlier in the week after crown fell off tooth.  She has some mild right mandibular swelling however does not extend into the submandibular region.  No fluctuance, induration.  Low suspicion for Ludwig's angina, deep space infection.  She has dental caries throughout as well as missing crown to right posterior dentition.  She has soft tissue fullness at right buccal fold periapical region consistent with abscess.  Recommended drainage.  She is agreeable.  Her tetanus is up-to-date.  See procedure note.  Patient tolerated well.  Moderate purulent drainage removed.  No packing placed.  She was started antibiotics, anti-inflammatories.  She has to follow-up outpatient with dentistry.  She was given resources.  Do not feel she needs additional labs, imaging, hospitalization at this time.  The patient has been appropriately medically screened and/or stabilized in the ED. I have low suspicion for any other emergent medical condition which would require further screening, evaluation or treatment in the ED or require inpatient management.  Patient is hemodynamically stable and in no acute  distress.  Patient able to ambulate in department prior to ED.  Evaluation does not show acute pathology that would require ongoing or additional emergent interventions while in the emergency department or further inpatient treatment.  I have discussed the diagnosis with the patient and answered all questions.  Pain is been managed while in the emergency department and patient has no further complaints prior to discharge.  Patient is comfortable with plan discussed in room and is stable for discharge at this time.  I have discussed strict return precautions for returning to the emergency department.  Patient was encouraged to follow-up with PCP/specialist refer to at discharge.                             Medical Decision Making Amount and/or Complexity of Data Reviewed External Data Reviewed: labs, radiology and notes.  Risk OTC drugs. Prescription drug management. Decision regarding hospitalization. Diagnosis or treatment significantly limited by social determinants of health.          Final Clinical Impression(s) / ED Diagnoses Final diagnoses:  Dental abscess    Rx / DC Orders ED Discharge Orders          Ordered    naproxen (NAPROSYN) 500 MG tablet  2 times daily        07/11/22 1116    amoxicillin (AMOXIL) 500 MG capsule  2 times daily        07/11/22 1116              Kurt Hoffmeier A, PA-C 07/11/22 1130    Gloris Manchester, MD 07/15/22 1708

## 2022-07-11 NOTE — Discharge Instructions (Signed)
We have drained your dental abscess.  Start taking the antibiotics.  We have also written for prescription strength anti-inflammatory.  Swish and spit with warm water after eating or drinking anything.  We want this area mouth to continue draining  Make sure to follow with dentistry if you cannot get into 1-1 Discharge paperwork  Warm compress to area  Return for new or worsening symptoms

## 2022-07-28 ENCOUNTER — Emergency Department (HOSPITAL_COMMUNITY): Payer: PRIVATE HEALTH INSURANCE

## 2022-07-28 ENCOUNTER — Other Ambulatory Visit: Payer: Self-pay

## 2022-07-28 ENCOUNTER — Emergency Department (HOSPITAL_COMMUNITY)
Admission: EM | Admit: 2022-07-28 | Discharge: 2022-07-28 | Disposition: A | Discharge status: Home / Self Care | Payer: PRIVATE HEALTH INSURANCE | Attending: Emergency Medicine | Admitting: Emergency Medicine

## 2022-07-28 DIAGNOSIS — Z1152 Encounter for screening for COVID-19: Secondary | ICD-10-CM | POA: Diagnosis not present

## 2022-07-28 DIAGNOSIS — Z21 Asymptomatic human immunodeficiency virus [HIV] infection status: Secondary | ICD-10-CM | POA: Diagnosis not present

## 2022-07-28 DIAGNOSIS — Z7951 Long term (current) use of inhaled steroids: Secondary | ICD-10-CM | POA: Insufficient documentation

## 2022-07-28 DIAGNOSIS — R051 Acute cough: Secondary | ICD-10-CM | POA: Diagnosis present

## 2022-07-28 DIAGNOSIS — J441 Chronic obstructive pulmonary disease with (acute) exacerbation: Secondary | ICD-10-CM | POA: Diagnosis not present

## 2022-07-28 DIAGNOSIS — Z7982 Long term (current) use of aspirin: Secondary | ICD-10-CM | POA: Insufficient documentation

## 2022-07-28 LAB — CBC WITH DIFFERENTIAL/PLATELET
Abs Immature Granulocytes: 0.02 10*3/uL (ref 0.00–0.07)
Basophils Absolute: 0 10*3/uL (ref 0.0–0.1)
Basophils Relative: 1 %
Eosinophils Absolute: 0 10*3/uL (ref 0.0–0.5)
Eosinophils Relative: 1 %
HCT: 39.4 % (ref 36.0–46.0)
Hemoglobin: 13.2 g/dL (ref 12.0–15.0)
Immature Granulocytes: 1 %
Lymphocytes Relative: 44 %
Lymphs Abs: 1.1 10*3/uL (ref 0.7–4.0)
MCH: 29.3 pg (ref 26.0–34.0)
MCHC: 33.5 g/dL (ref 30.0–36.0)
MCV: 87.6 fL (ref 80.0–100.0)
Monocytes Absolute: 0.5 10*3/uL (ref 0.1–1.0)
Monocytes Relative: 21 %
Neutro Abs: 0.8 10*3/uL — ABNORMAL LOW (ref 1.7–7.7)
Neutrophils Relative %: 32 %
Platelets: 264 10*3/uL (ref 150–400)
RBC: 4.5 MIL/uL (ref 3.87–5.11)
RDW: 13.8 % (ref 11.5–15.5)
WBC: 2.4 10*3/uL — ABNORMAL LOW (ref 4.0–10.5)
nRBC: 0 % (ref 0.0–0.2)

## 2022-07-28 LAB — BASIC METABOLIC PANEL
Anion gap: 10 (ref 5–15)
BUN: 15 mg/dL (ref 6–20)
CO2: 24 mmol/L (ref 22–32)
Calcium: 9.2 mg/dL (ref 8.9–10.3)
Chloride: 102 mmol/L (ref 98–111)
Creatinine, Ser: 0.69 mg/dL (ref 0.44–1.00)
GFR, Estimated: >60 mL/min (ref >60)
Glucose, Bld: 85 mg/dL (ref 70–99)
Potassium: 4 mmol/L (ref 3.5–5.1)
Sodium: 136 mmol/L (ref 135–145)

## 2022-07-28 LAB — RESP PANEL BY RT-PCR (RSV, FLU A&B, COVID)  RVPGX2
Influenza A by PCR: NEGATIVE
Influenza B by PCR: NEGATIVE
Resp Syncytial Virus by PCR: NEGATIVE
SARS Coronavirus 2 by RT PCR: NEGATIVE

## 2022-07-28 MED ORDER — DOXYCYCLINE HYCLATE 100 MG PO TABS
100.0000 mg | ORAL_TABLET | Freq: Once | ORAL | Status: AC
  Administered 2022-07-28: 100 mg via ORAL
  Filled 2022-07-28: qty 1

## 2022-07-28 MED ORDER — IPRATROPIUM-ALBUTEROL 0.5-2.5 (3) MG/3ML IN SOLN
3.0000 mL | Freq: Once | RESPIRATORY_TRACT | Status: AC
  Administered 2022-07-28: 3 mL via RESPIRATORY_TRACT
  Filled 2022-07-28: qty 3

## 2022-07-28 MED ORDER — DOXYCYCLINE HYCLATE 100 MG PO CAPS: 100.0000 mg | ORAL_CAPSULE | Freq: Two times a day (BID) | ORAL | 0 refills | Status: DC

## 2022-07-28 MED ORDER — PREDNISONE 20 MG PO TABS: ORAL_TABLET | ORAL | 0 refills | Status: DC

## 2022-07-28 MED ORDER — PREDNISONE 20 MG PO TABS
60.0000 mg | ORAL_TABLET | Freq: Once | ORAL | Status: AC
  Administered 2022-07-28: 60 mg via ORAL
  Filled 2022-07-28: qty 3

## 2022-07-28 MED ORDER — ALBUTEROL SULFATE HFA 108 (90 BASE) MCG/ACT IN AERS
2.0000 | INHALATION_SPRAY | RESPIRATORY_TRACT | Status: DC | PRN
  Administered 2022-07-28: 2 via RESPIRATORY_TRACT
  Filled 2022-07-28: qty 6.7

## 2022-07-28 NOTE — ED Triage Notes (Signed)
Pt c/o cough, and wheezing for 3x days. Pt has been taking Theraflu at home

## 2022-07-28 NOTE — Discharge Instructions (Addendum)
Make an appointment to follow-up with Dr. Everardo All as discussed.  Return to the emergency room if you have any worsening symptoms.  Your blood pressure was elevated today.  Please have this rechecked by your primary care doctor.

## 2022-07-28 NOTE — ED Provider Notes (Addendum)
[place] EMERGENCY DEPARTMENT AT [facility] Provider Note   CSN: [id number] Arrival date & time: 07/28/22  1007     History  Chief Complaint  Patient presents with   Cough   Wheezing    Courtney Kim is a 57 y.o. female.  Patient is a 57 year old female who presents with cough.  She has a history of HIV.  She also has a documented history of emphysema although she denies any prior history of wheezing or lung disease.  She said that over the last 3 days she has had a cough which is productive of some sputum.  She is also had some wheezing and shortness of breath.  No associated chest pain.  No leg swelling.  No known fevers.  She denies any history of wheezing in the past.  She does not take inhalers at home according to her.       Home Medications Prior to Admission medications   Medication Sig Start Date End Date Taking? Authorizing Provider  doxycycline (VIBRAMYCIN) 100 MG capsule Take 1 capsule (100 mg total) by mouth 2 (two) times daily. One po bid x 7 days 07/28/22  Yes Rolan Bucco, MD  predniSONE (DELTASONE) 20 MG tablet 2 tabs po daily x 4 days 07/28/22  Yes Rolan Bucco, MD  albuterol (VENTOLIN HFA) 108 (90 Base) MCG/ACT inhaler TAKE 2 PUFFS BY MOUTH EVERY 6 HOURS AS NEEDED FOR WHEEZE OR SHORTNESS OF BREATH 11/28/21   Luciano Cutter, MD  aspirin EC 81 MG tablet Take 81 mg by mouth daily. Swallow whole.    [provider]  bictegravir-emtricitabine-tenofovir AF (BIKTARVY) 50-200-25 MG TABS tablet TAKE 1 TABLET BY MOUTH AT THE SAME TIME EVERY DAY WITH OR WITHOUT FOOD 04/17/22   Blanchard Kelch, NP  budesonide-formoterol (SYMBICORT) 160-4.5 MCG/ACT inhaler Inhale 2 puffs into the lungs in the morning and at bedtime. 11/08/21   Luciano Cutter, MD  ferrous sulfate 325 (65 FE) MG tablet Take 1 tablet (325 mg total) by mouth daily with breakfast. Please take with a source of Vitamin C 07/23/19   Jaci Standard, MD  fluconazole (DIFLUCAN) 150 MG  tablet Take 1 tablet (150 mg total) by mouth daily. -For your yeast infection, start the Diflucan (fluconazole)- Take one pill today (day 1). If you're still having symptoms in 3 days, take the second pill. 07/13/21   Rhys Martini, PA-C  meclizine (ANTIVERT) 25 MG tablet Take 1 tablet (25 mg total) by mouth 3 (three) times daily as needed for dizziness. 07/22/20   Blanchard Kelch, NP  naproxen (NAPROSYN) 500 MG tablet Take 1 tablet (500 mg total) by mouth 2 (two) times daily. 07/11/22   Henderly, Britni A, PA-C  omeprazole (PRILOSEC) 40 MG capsule Take 40 mg by mouth daily. 05/19/21   [provider]  potassium chloride SA (KLOR-CON M) 20 MEQ tablet Take 1 tablet (20 mEq total) by mouth 2 (two) times daily. 03/25/22   Margarita Grizzle, MD  Spacer/Aero-Hold Chamber Bags MISC 1 each by Does not apply route daily as needed. 11/08/21   Luciano Cutter, MD  Tiotropium Bromide Monohydrate (SPIRIVA RESPIMAT) 2.5 MCG/ACT AERS Inhale 2 puffs into the lungs daily. 06/13/21   Luciano Cutter, MD      Allergies    Hydrocodone-acetaminophen    Review of Systems   Review of Systems  Constitutional:  Positive for fatigue. Negative for chills, diaphoresis and fever.  HENT:  Negative for congestion, rhinorrhea and sneezing.  Eyes: Negative.   Respiratory:  Positive for cough, shortness of breath and wheezing. Negative for chest tightness.   Cardiovascular:  Negative for chest pain and leg swelling.  Gastrointestinal:  Negative for abdominal pain, blood in stool, diarrhea, nausea and vomiting.  Genitourinary:  Negative for difficulty urinating, flank pain, frequency and hematuria.  Musculoskeletal:  Negative for arthralgias and back pain.  Skin:  Negative for rash.  Neurological:  Negative for dizziness, speech difficulty, weakness, numbness and headaches.    Physical Exam Updated Vital Signs BP (!) 156/105   Pulse 73   Temp 98.5 F (36.9 C) (Oral)   Resp 18   Ht 5\' 8"  (1.727 m)   Wt 71.2 kg    SpO2 99%   BMI 23.87 kg/m  Physical Exam Constitutional:      Appearance: She is well-developed.  HENT:     Head: Normocephalic and atraumatic.  Eyes:     Pupils: Pupils are equal, round, and reactive to light.  Cardiovascular:     Rate and Rhythm: Normal rate and regular rhythm.     Heart sounds: Normal heart sounds.  Pulmonary:     Effort: Pulmonary effort is normal. No respiratory distress.     Breath sounds: Wheezing present. No rales.     Comments: Mildly decreased breath sounds bilaterally with some expiratory wheezing in the lower fields Chest:     Chest wall: No tenderness.  Abdominal:     General: Bowel sounds are normal.     Palpations: Abdomen is soft.     Tenderness: There is no abdominal tenderness. There is no guarding or rebound.  Musculoskeletal:        General: Normal range of motion.     Cervical back: Normal range of motion and neck supple.     Comments: No edema or calf tenderness  Lymphadenopathy:     Cervical: No cervical adenopathy.  Skin:    General: Skin is warm and dry.     Findings: No rash.  Neurological:     Mental Status: She is alert and oriented to person, place, and time.     ED Results / Procedures / Treatments   Labs (all labs ordered are listed, but only abnormal results are displayed) Labs Reviewed  CBC WITH DIFFERENTIAL/PLATELET - Abnormal; Notable for the following components:      Result Value   WBC 2.4 (*)    Neutro Abs 0.8 (*)    All other components within normal limits  RESP PANEL BY RT-PCR (RSV, FLU A&B, COVID)  [id number]  BASIC METABOLIC PANEL    EKG None ED ECG REPORT   Date: 07/28/2022  Rate: 86  Rhythm: normal sinus rhythm  QRS Axis: normal  Intervals: normal  ST/T Wave abnormalities: nonspecific ST changes  Conduction Disutrbances:none  Narrative Interpretation:   Old EKG Reviewed: unchanged  I have personally reviewed the EKG tracing and agree with the computerized printout as noted.   Radiology DG  Chest 2 View  Result Date: 07/28/2022 CLINICAL DATA:  Cough. EXAM: CHEST - 2 VIEW COMPARISON:  03/25/2022. FINDINGS: Clear lungs. Normal heart size and mediastinal contours. No pleural effusion or pneumothorax. Visualized bones and upper abdomen are unremarkable. IMPRESSION: No evidence of acute cardiopulmonary disease. Electronically Signed   By: Orvan Falconer M.D.   On: 07/28/2022 11:43    Procedures Procedures    Medications Ordered in ED Medications  doxycycline (VIBRA-TABS) tablet 100 mg (has no administration in time range)  albuterol (VENTOLIN HFA) 108 (90  Base) MCG/ACT inhaler 2 puff (has no administration in time range)  ipratropium-albuterol (DUONEB) 0.5-2.5 (3) MG/3ML nebulizer solution 3 mL (3 mLs Nebulization Given 07/28/22 1100)  predniSONE (DELTASONE) tablet 60 mg (60 mg Oral Given 07/28/22 1156)    ED Course/ Medical Decision Making/ A&P                             Medical Decision Making Amount and/or Complexity of Data Reviewed Labs: ordered. Radiology: ordered.  Risk Prescription drug management.   Patient is a 57 year old who presents with wheezing and shortness of breath.  No hypoxia.  No associated chest pain or suggestions of ACS.  Her EKG does not show any ischemic changes.  Chest x-ray two-view was interpreted by me and confirmed by the radiologist to show no evidence of pneumonia.  No pulmonary edema.  Labs reviewed and are nonconcerning.  Her COVID/flu test was negative.  She was given nebulizer treatment and feels much better after this.  Repeat lung exam does show some ongoing wheezing but no increased work of breathing and she overall says she feels better.  I discussed giving her a second nebulizer treatment but she is declining.  She says that she has to go to work this afternoon and needs to get out of here.  I did review her records.  She was diagnosed with COPD and has been on different preventative medications although she says they have not been  effective.  Most recently she was prescribed Symbicort by her pulmonologist although she stopped taking it because she said it makes her cough worse.  She was given albuterol MDI which she did use but show she is currently out of it.  Will dispense her an albuterol inhaler.  Will start her on a 5-day course of prednisone as well as doxycycline.  She was given first doses here in the ED.  Encouraged her to have close follow-up with Dr. Everardo All.  Return precautions were given.  Her blood pressure was mildly elevated in the ED.  I reviewed her records and on her last outpatient visit, it was 138/70.  At this point we will not start her on antihypertensive medication as she needs further monitoring of her blood pressure prior to making this decision.  Advised her to follow-up with her primary care doctor regarding this.  Final Clinical Impression(s) / ED Diagnoses Final diagnoses:  COPD exacerbation (HCC)    Rx / DC Orders ED Discharge Orders          Ordered    predniSONE (DELTASONE) 20 MG tablet        07/28/22 1206    doxycycline (VIBRAMYCIN) 100 MG capsule  2 times daily        07/28/22 1206              Rolan Bucco, MD 07/28/22 1211    Rolan Bucco, MD 07/28/22 1213

## 2022-08-02 NOTE — Progress Notes (Signed)
The 10-year ASCVD risk score (Arnett DK, et al., 2019) is: 9.8%   Values used to calculate the score:     Age: 57 years     Sex: Female     Is Non-Hispanic African American: Yes     Diabetic: No     Tobacco smoker: Yes     Systolic Blood Pressure: 152 mmHg     Is BP treated: No     HDL Cholesterol: 60 mg/dL     Total Cholesterol: 173 mg/dL  Sandie Ano, RN

## 2022-08-20 IMAGING — MG DIGITAL SCREENING BILAT W/ TOMO W/ CAD
8 series · 8 of 24 positions shown · non-contrast
Comparison: None.

CLINICAL DATA: Screening.

EXAM:
DIGITAL SCREENING BILATERAL MAMMOGRAM WITH TOMO AND CAD

[R CC synth-2D]
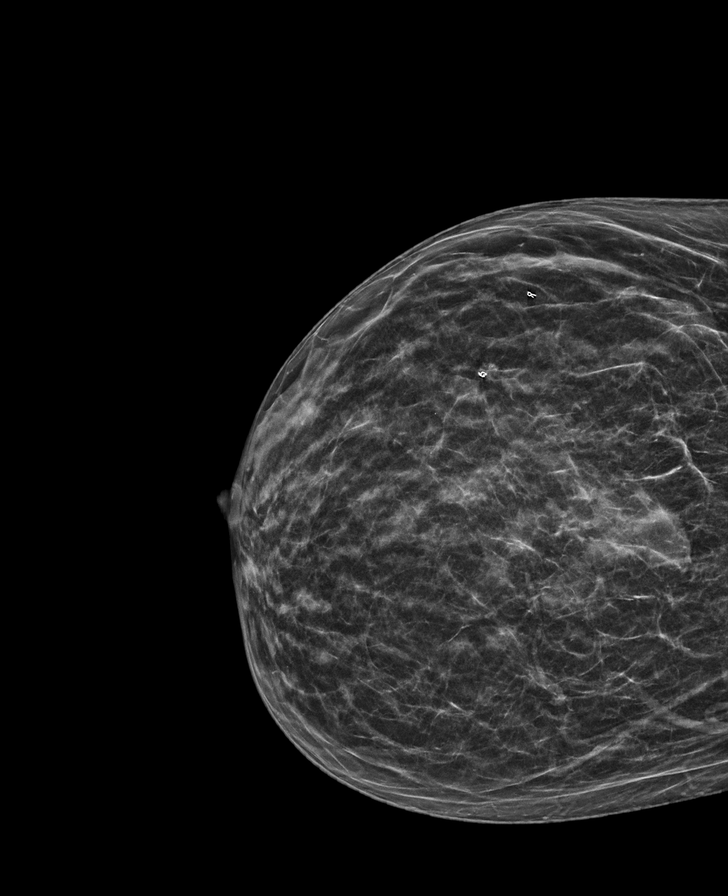

[L MLO synth-2D]
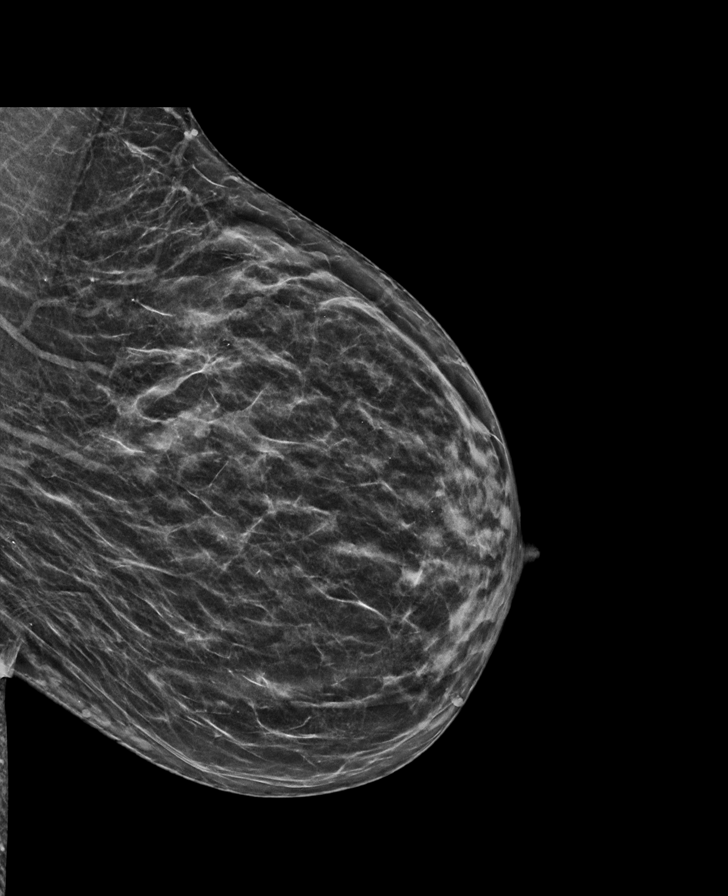

[L CC synth-2D]
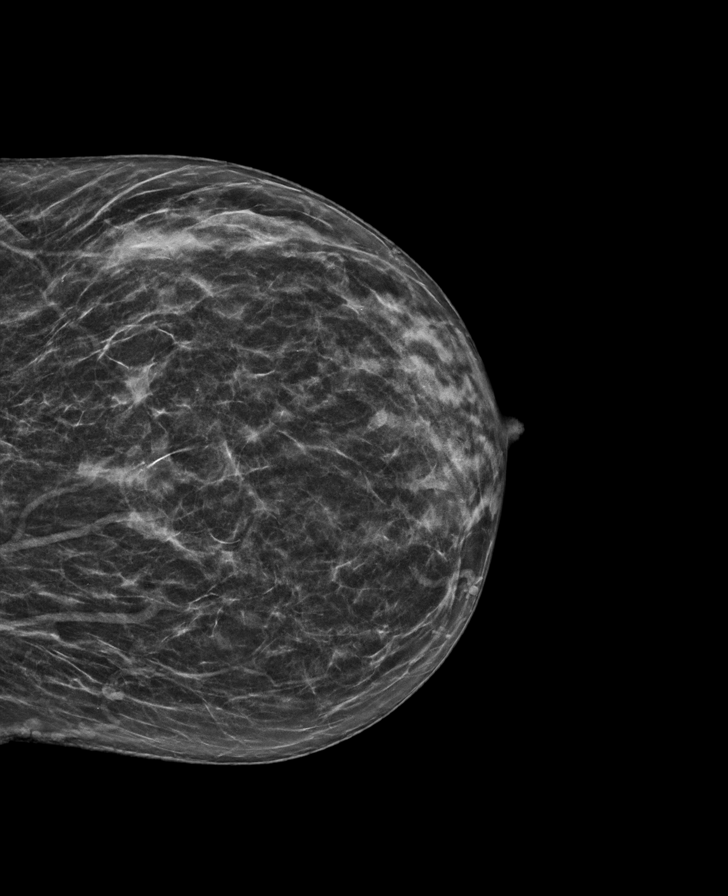

[R MLO synth-2D]
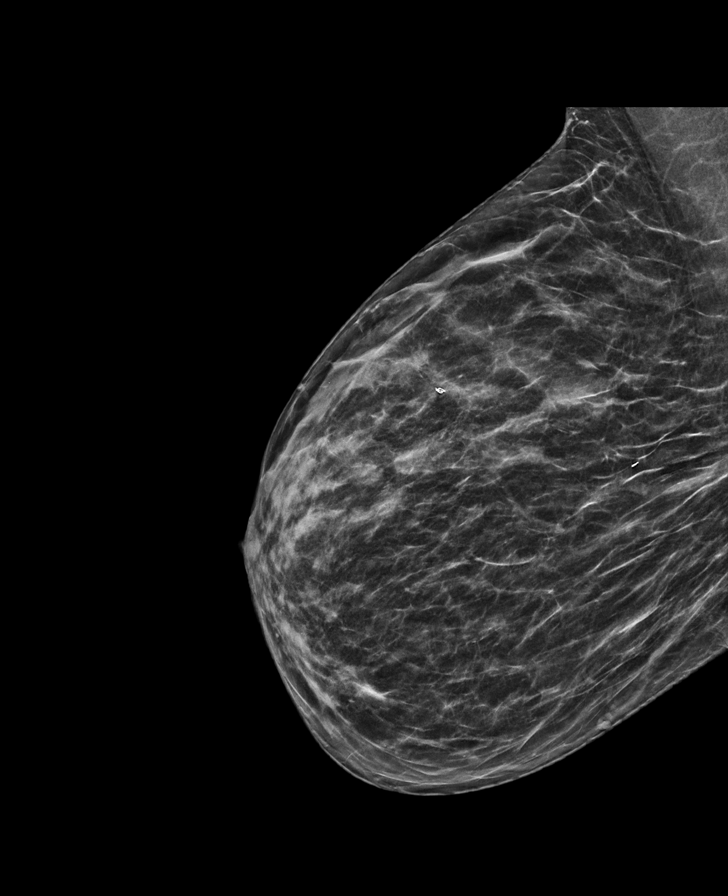

[R MLO tomo · tomo slice 28/55.0]
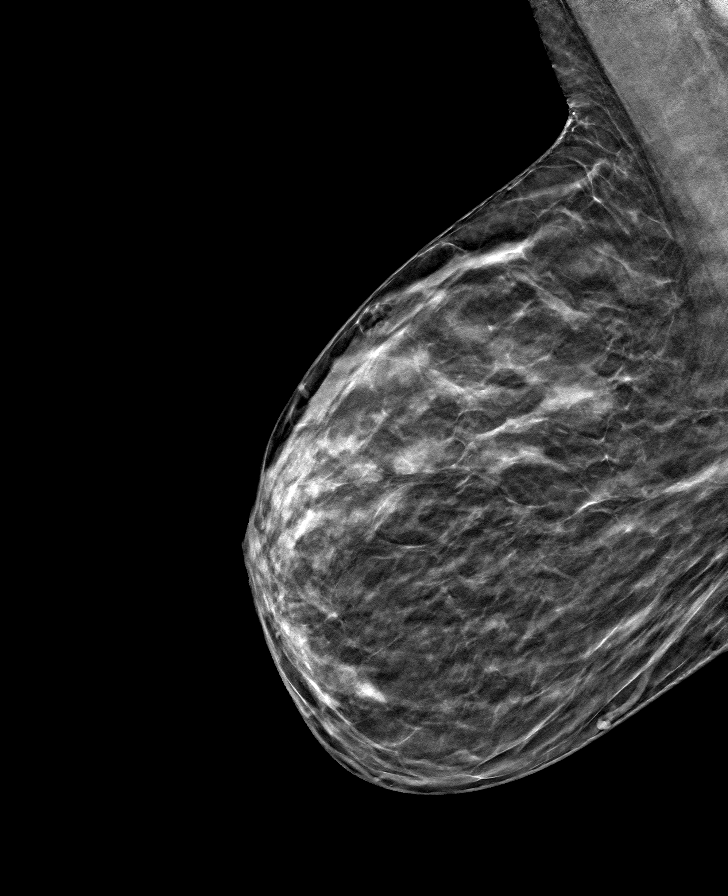

[R CC tomo · tomo slice 25/49.0]
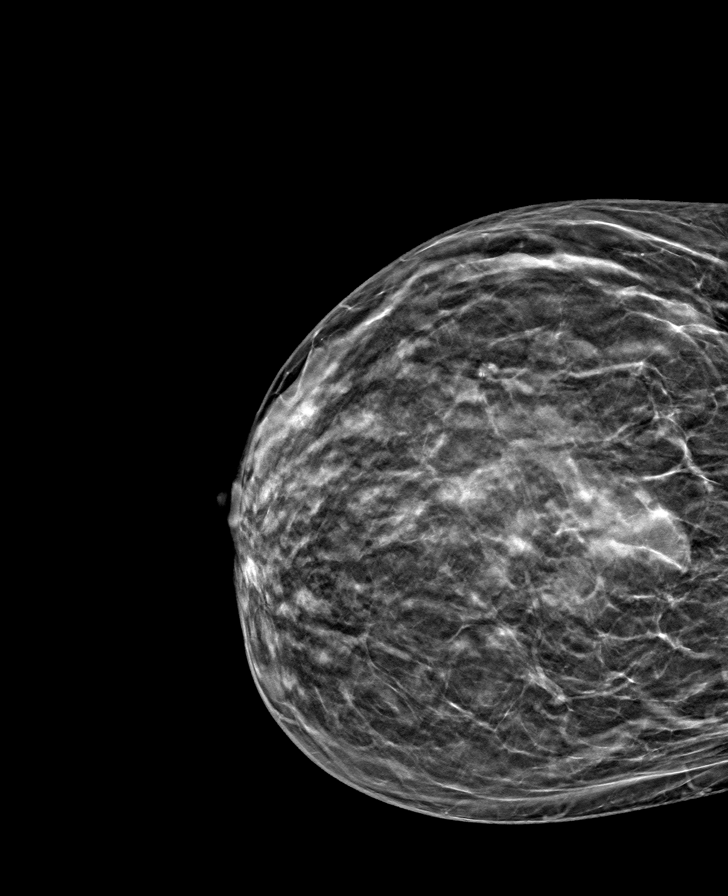

[L MLO tomo · tomo slice 27/54.0]
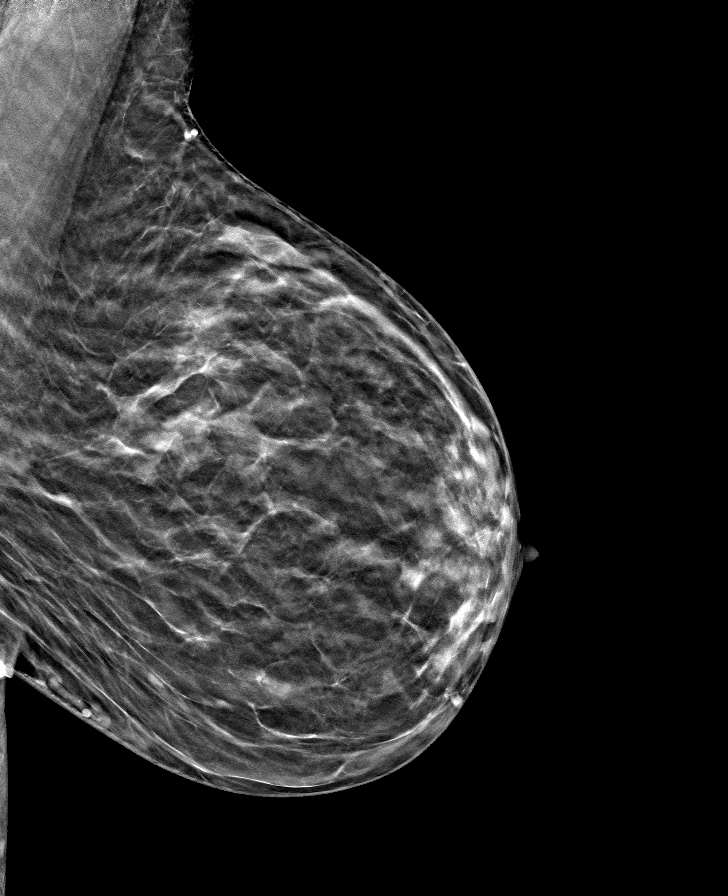

[L CC tomo · tomo slice 27/53.0]
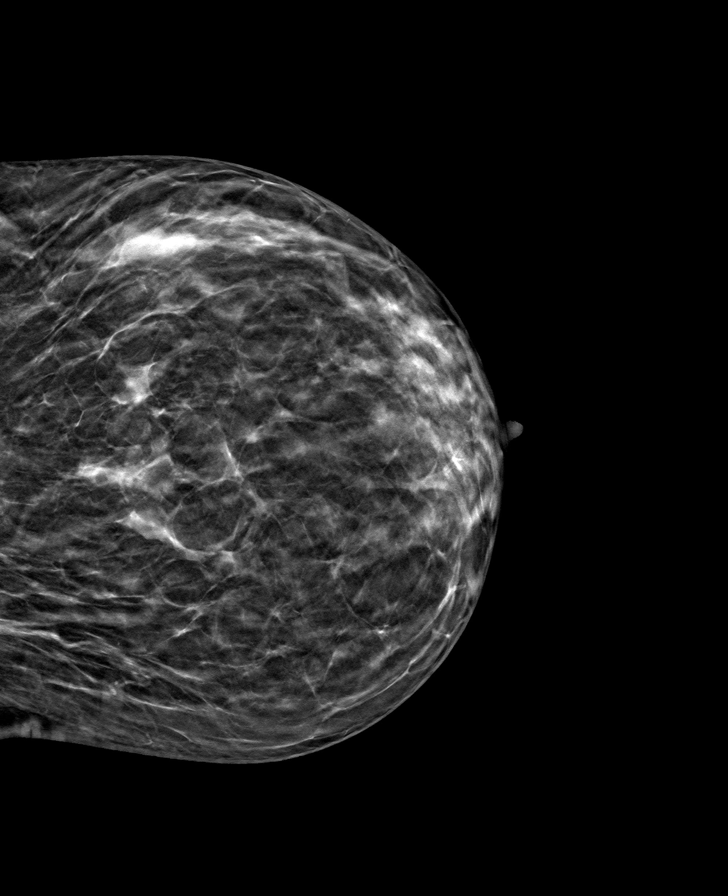

[8 of 24 positions shown; findings below may reference images not displayed]

ACR Breast Density Category b: There are scattered areas of
fibroglandular density.
FINDINGS: There are no findings suspicious for malignancy. Images were
processed with CAD.
IMPRESSION: No mammographic evidence of malignancy. A result letter of this
screening mammogram will be mailed directly to the patient.

RECOMMENDATION:
Screening mammogram in one year. (Code:Y5-G-EJ6)

BI-RADS CATEGORY  1: Negative.

## 2022-08-26 ENCOUNTER — Telehealth (HOSPITAL_BASED_OUTPATIENT_CLINIC_OR_DEPARTMENT_OTHER): Payer: Self-pay | Admitting: Pulmonary Disease

## 2022-08-29 NOTE — Telephone Encounter (Signed)
Schedule ED follow up.

## 2022-08-30 ENCOUNTER — Ambulatory Visit (HOSPITAL_BASED_OUTPATIENT_CLINIC_OR_DEPARTMENT_OTHER): Payer: Medicaid Other | Admitting: Pulmonary Disease

## 2022-09-27 ENCOUNTER — Encounter: Payer: Self-pay | Admitting: Nurse Practitioner

## 2022-09-27 ENCOUNTER — Ambulatory Visit: Payer: PRIVATE HEALTH INSURANCE | Admitting: Nurse Practitioner

## 2022-09-27 VITALS — BP 154/115 | HR 97 | Temp 97.7°F

## 2022-09-27 DIAGNOSIS — I10 Essential (primary) hypertension: Secondary | ICD-10-CM

## 2022-09-27 DIAGNOSIS — K047 Periapical abscess without sinus: Secondary | ICD-10-CM

## 2022-09-27 MED ORDER — AMOXICILLIN-POT CLAVULANATE 875-125 MG PO TABS: 1.0000 | ORAL_TABLET | Freq: Two times a day (BID) | ORAL | 0 refills | Status: AC

## 2022-09-27 MED ORDER — AMOXICILLIN-POT CLAVULANATE 875-125 MG PO TABS: 1.0000 | ORAL_TABLET | Freq: Two times a day (BID) | ORAL | 0 refills | Status: DC

## 2022-09-27 NOTE — Progress Notes (Signed)
Acute Office Visit  Subjective:     Patient ID: Courtney Kim, female    DOB: 10-07-1965, 57 y.o.   MRN: [id number]  Chief Complaint  Patient presents with   Dental Pain    Abscess    Patient is in today for c/o of tooth abscess on right lower mouth. Reports pain is 8/10. Present x 2 days.  Hx of this, last abscess was in May 2024.  Has an appointment with the dentist next Thursday 10/04/22 for evaluation.  Denies fever.   Review of Systems  Constitutional:  Negative for chills and fever.       Objective:    BP (!) 154/115   Pulse 97   Temp 97.7 F (36.5 C)   SpO2 98%    Physical Exam Constitutional:      General: She is not in acute distress. HENT:     Head: Normocephalic.     Mouth/Throat:     Dentition: Gingival swelling, dental caries and dental abscesses present.     Pharynx: Oropharynx is clear. Uvula midline.      Comments: Several missing teeth. Abscess located on right lower mouth . No pus noted.  Pulmonary:     Effort: Pulmonary effort is normal.  Neurological:     Mental Status: She is alert.     No results found for any visits on 09/27/22.      Assessment & Plan:   Problem List Items Addressed This Visit   None Visit Diagnoses     Dental abscess    -  Primary Recommended follow-up with dentist as scheduled. Discussed red flag signs and when to seek care.   Relevant Medications      amoxicillin-clavulanate (AUGMENTIN) 875-125 MG tablet   Primary hypertension      Hx of HTN, BP elevated today likely secondary to dental pain. Recommended checking BP daily once dental pain has improved and taking BP log to PCP appointment which she reports is scheduled Sept 8, 2024.       Meds ordered this encounter  Medications   DISCONTD: amoxicillin-clavulanate (AUGMENTIN) 875-125 MG tablet    Sig: Take 1 tablet by mouth 2 (two) times daily for 7 days.    Dispense:  14 tablet    Refill:  0    Order Specific Question:   Supervising Provider    Answer:    Erasmo Downer [[id number]]   amoxicillin-clavulanate (AUGMENTIN) 875-125 MG tablet    Sig: Take 1 tablet by mouth 2 (two) times daily for 7 days.    Dispense:  14 tablet    Refill:  0    Order Specific Question:   Supervising Provider    Answer:   Erasmo Downer [[id number]]    Follow-up as needed.   Gloris Ham, NP

## 2022-10-08 ENCOUNTER — Ambulatory Visit: Payer: PRIVATE HEALTH INSURANCE | Admitting: Infectious Diseases

## 2023-04-08 ENCOUNTER — Other Ambulatory Visit: Payer: Self-pay | Admitting: Infectious Diseases

## 2023-04-08 DIAGNOSIS — B20 Human immunodeficiency virus [HIV] disease: Secondary | ICD-10-CM

## 2023-04-11 ENCOUNTER — Other Ambulatory Visit: Payer: Self-pay

## 2023-04-11 ENCOUNTER — Telehealth: Payer: Self-pay

## 2023-04-11 DIAGNOSIS — B20 Human immunodeficiency virus [HIV] disease: Secondary | ICD-10-CM

## 2023-04-11 MED ORDER — BIKTARVY 50-200-25 MG PO TABS: 1.0000 | ORAL_TABLET | Freq: Every day | ORAL | 0 refills | Status: DC

## 2023-04-11 NOTE — Telephone Encounter (Signed)
 Patient called for Biktarvy refill. Appears this was mistakenly discontinued, but dispense history is consistent. Will send in new Rx. Patient is looking to switch pharmacies and is open to trying [facility]. Follow up scheduled.   Sandie Ano, RN

## 2023-04-30 ENCOUNTER — Ambulatory Visit: Payer: Medicaid Other | Admitting: Infectious Diseases

## 2023-06-03 ENCOUNTER — Telehealth: Payer: Self-pay

## 2023-06-03 ENCOUNTER — Other Ambulatory Visit (HOSPITAL_COMMUNITY): Payer: Self-pay

## 2023-06-03 DIAGNOSIS — B20 Human immunodeficiency virus [HIV] disease: Secondary | ICD-10-CM

## 2023-06-03 MED ORDER — BIKTARVY 50-200-25 MG PO TABS: 1.0000 | ORAL_TABLET | Freq: Every day | ORAL | 0 refills | Status: DC

## 2023-06-03 NOTE — Telephone Encounter (Signed)
 Notified that Courtney Kim was requesting call back. She would like a refill of her Biktarvy. Discussed importance of routine follow up and scheduled appointment with Trevor Fudge for 5/2. Discussed that she would need to keep that appointment for additional refills since she was last here 09/2021.  She says her Medicaid was terminated and that she now has Social research officer, government. She thinks she still has copay assistance for her Biktarvy. Would like it sent to [facility] on [address]. Routing to pharmacy team for insurance verification.   [name], BSN, RN

## 2023-06-04 ENCOUNTER — Other Ambulatory Visit (HOSPITAL_COMMUNITY): Payer: Self-pay

## 2023-06-04 NOTE — Telephone Encounter (Signed)
 Spoke with Pearly, she says she [employer], but has not yet received her insurance card. Mentions she has to pay $40 before they'll send it to her. Asked her to call back once she has plan info/member ID. Also discussed that dispensing pharmacy would need that information.   She has one pill left. She is unable to come into the clinic to get samples since she works 7a-7p Tues, [place], [place] and the clinic is closed this Friday.   Lizvet Chunn, BSN, RN

## 2023-06-04 NOTE — Telephone Encounter (Signed)
 Spoke with Courtney Kim, asked if she could email her check stubs to [email or web address] with the word "[email or web address]" in the subject line for financial counselor. She states she has to be cautious and would prefer to just bring them in person next week.   Alezander Dimaano, BSN, RN

## 2023-06-10 ENCOUNTER — Emergency Department (HOSPITAL_COMMUNITY)
Admission: EM | Admit: 2023-06-10 | Discharge: 2023-06-10 | Disposition: A | Discharge status: Home / Self Care | Payer: Self-pay | Attending: Emergency Medicine | Admitting: Emergency Medicine

## 2023-06-10 ENCOUNTER — Other Ambulatory Visit: Payer: Self-pay

## 2023-06-10 ENCOUNTER — Encounter (HOSPITAL_COMMUNITY): Payer: Self-pay | Admitting: Emergency Medicine

## 2023-06-10 DIAGNOSIS — Z7982 Long term (current) use of aspirin: Secondary | ICD-10-CM | POA: Insufficient documentation

## 2023-06-10 DIAGNOSIS — Z21 Asymptomatic human immunodeficiency virus [HIV] infection status: Secondary | ICD-10-CM | POA: Insufficient documentation

## 2023-06-10 DIAGNOSIS — J441 Chronic obstructive pulmonary disease with (acute) exacerbation: Secondary | ICD-10-CM | POA: Insufficient documentation

## 2023-06-10 LAB — RESP PANEL BY RT-PCR (RSV, FLU A&B, COVID)  RVPGX2
Influenza A by PCR: NEGATIVE
Influenza B by PCR: NEGATIVE
Resp Syncytial Virus by PCR: NEGATIVE
SARS Coronavirus 2 by RT PCR: NEGATIVE

## 2023-06-10 MED ORDER — BENZONATATE 100 MG PO CAPS: 100.0000 mg | ORAL_CAPSULE | Freq: Three times a day (TID) | ORAL | 0 refills | Status: AC

## 2023-06-10 MED ORDER — DOXYCYCLINE HYCLATE 100 MG PO CAPS: 100.0000 mg | ORAL_CAPSULE | Freq: Two times a day (BID) | ORAL | 0 refills | Status: AC

## 2023-06-10 MED ORDER — HYDROCODONE BIT-HOMATROP MBR 5-1.5 MG/5ML PO SOLN: 5.0000 mL | Freq: Four times a day (QID) | ORAL | 0 refills | Status: AC | PRN

## 2023-06-10 NOTE — ED Provider Notes (Signed)
 [place] EMERGENCY DEPARTMENT AT [facility] Provider Note   CSN: [id number] Arrival date & time: 06/10/23  1429     History  Chief Complaint  Patient presents with   Flu like symptoms    Courtney Kim is a 58 y.o. female.  58 year old female with past medical history significant for potential COPD presents today for 4-day duration of cough.  She states that she is being seen by pulmonologist for COPD evaluation.  Works at a nursing home.  No known sick contacts.  Reports compliance with home medicines. Denies chest pain, shortness of breath, vision change, or balance issues.  The history is provided by the patient. No language interpreter was used.       Home Medications Prior to Admission medications   Medication Sig Start Date End Date Taking? Authorizing Provider  benzonatate  (TESSALON ) 100 MG capsule Take 1 capsule (100 mg total) by mouth every 8 (eight) hours. 06/10/23  Yes Lacy Taglieri, PA-C  doxycycline  (VIBRAMYCIN ) 100 MG capsule Take 1 capsule (100 mg total) by mouth 2 (two) times daily. 06/10/23  Yes Caylan Chenard, PA-C  HYDROcodone  bit-homatropine (HYCODAN) 5-1.5 MG/5ML syrup Take 5 mLs by mouth every 6 (six) hours as needed for cough. 06/10/23  Yes Ceclia Cohens, Eltha Tingley, PA-C  albuterol  (VENTOLIN  HFA) 108 (90 Base) MCG/ACT inhaler TAKE 2 PUFFS BY MOUTH EVERY 6 HOURS AS NEEDED FOR WHEEZE OR SHORTNESS OF BREATH 11/28/21   Quillian Brunt, MD  aspirin EC 81 MG tablet Take 81 mg by mouth daily. Swallow whole.    [provider]  bictegravir-emtricitabine-tenofovir AF (BIKTARVY ) 50-200-25 MG TABS tablet Take 1 tablet by mouth daily. 06/03/23   Orson Blalock, NP  budesonide -formoterol  (SYMBICORT ) 160-4.5 MCG/ACT inhaler Inhale 2 puffs into the lungs in the morning and at bedtime. 11/08/21   Quillian Brunt, MD  ferrous sulfate  325 (65 FE) MG tablet Take 1 tablet (325 mg total) by mouth daily with breakfast. Please take with a source of Vitamin C 07/23/19   Dorsey,  John T IV, MD  Spacer/Aero-Hold Chamber Bags MISC 1 each by Does not apply route daily as needed. 11/08/21   Quillian Brunt, MD  Tiotropium Bromide Monohydrate  (SPIRIVA  RESPIMAT) 2.5 MCG/ACT AERS Inhale 2 puffs into the lungs daily. 06/13/21   Quillian Brunt, MD      Allergies    Hydrocodone -acetaminophen    Review of Systems   Review of Systems  Constitutional:  Negative for chills and fever.  Respiratory:  Positive for cough. Negative for shortness of breath.   Cardiovascular:  Negative for chest pain.  All other systems reviewed and are negative.   Physical Exam Updated Vital Signs BP (!) 170/120   Pulse 89   Temp 98.3 F (36.8 C) (Oral)   Resp 18   Ht 5\' 8"  (1.727 m)   Wt 71.2 kg   SpO2 90%   BMI 23.87 kg/m  Physical Exam Vitals and nursing note reviewed.  Constitutional:      General: She is not in acute distress.    Appearance: Normal appearance. She is not ill-appearing.  HENT:     Head: Normocephalic and atraumatic.     Nose: Nose normal.  Eyes:     Conjunctiva/sclera: Conjunctivae normal.  Cardiovascular:     Rate and Rhythm: Normal rate and regular rhythm.  Pulmonary:     Effort: Pulmonary effort is normal. No respiratory distress.  Musculoskeletal:        General: No deformity. Normal range of  motion.     Cervical back: Normal range of motion.  Skin:    Findings: No rash.  Neurological:     Mental Status: She is alert.     ED Results / Procedures / Treatments   Labs (all labs ordered are listed, but only abnormal results are displayed) Labs Reviewed  RESP PANEL BY RT-PCR (RSV, FLU A&B, COVID)  [id number]    EKG None  Radiology No results found.  Procedures Procedures    Medications Ordered in ED Medications - No data to display  ED Course/ Medical Decision Making/ A&P                                 Medical Decision Making Risk Prescription drug management.   58 year old female presents today for concern of 4-day duration of  cough.  She denies fever, shortness of breath, chest pain. Respiratory panel negative. Has history of HIV. Will start on antibiotics.  She will follow-up with her PCP. Return precautions discussed. Patient voices understanding and is in agreement with plan.   Final Clinical Impression(s) / ED Diagnoses Final diagnoses:  COPD exacerbation (HCC)    Rx / DC Orders ED Discharge Orders          Ordered    doxycycline  (VIBRAMYCIN ) 100 MG capsule  2 times daily        06/10/23 2134    HYDROcodone  bit-homatropine (HYCODAN) 5-1.5 MG/5ML syrup  Every 6 hours PRN        06/10/23 2134    benzonatate  (TESSALON ) 100 MG capsule  Every 8 hours        06/10/23 2134              Lucina Sabal, PA-C 06/10/23 2141    Merdis Stalling, MD 06/10/23 2209

## 2023-06-10 NOTE — ED Triage Notes (Signed)
 Patient c/o flu like symptoms x 4 days. Patient denies fever at home. Patient denies nausea/vomiting. Patient ambulatory at triage.

## 2023-06-10 NOTE — Discharge Instructions (Addendum)
 Negative respiratory panel for COVID, flu, RSV.  Antibiotic sent into your pharmacy.  Follow-up with your PCP.  Return for any concerning symptoms. The cough syrup will make you drowsy.  Only use for severe coughing spells.

## 2023-06-21 ENCOUNTER — Other Ambulatory Visit: Payer: Self-pay | Admitting: Pharmacist

## 2023-06-21 ENCOUNTER — Ambulatory Visit: Payer: Self-pay | Admitting: Infectious Diseases

## 2023-06-21 DIAGNOSIS — B20 Human immunodeficiency virus [HIV] disease: Secondary | ICD-10-CM

## 2023-06-21 MED ORDER — BICTEGRAVIR-EMTRICITAB-TENOFOV 50-200-25 MG PO TABS: 1.0000 | ORAL_TABLET | Freq: Every day | ORAL | Status: AC

## 2023-06-21 NOTE — Progress Notes (Signed)
 Medication Samples have been provided to the patient.  Drug name: Biktarvy         Strength: 50/200/25 mg       Qty: 14 tablets (2 bottles) LOT: CTGMDA   Exp.Date: 7/27  Samples requested by Gibson Kurtz, NP.  Dosing instructions: Take one tablet by mouth once daily  The patient has been instructed regarding the correct time, dose, and frequency of taking this medication, including desired effects and most common side effects.   Nicklas Barns, PharmD, CPP, BCIDP, AAHIVP Clinical Pharmacist Practitioner Infectious Diseases Clinical Pharmacist [facility]

## 2023-07-03 NOTE — Progress Notes (Signed)
 The 10-year ASCVD risk score (Arnett DK, et al., 2019) is: 9%   Values used to calculate the score:     Age: 58 years     Sex: Female     Is Non-Hispanic African American: Yes     Diabetic: No     Tobacco smoker: Yes     Systolic Blood Pressure: 144 mmHg     Is BP treated: No     HDL Cholesterol: 60 mg/dL     Total Cholesterol: 173 mg/dL  No current statin therapy. Next appointment note updated.   Keyshun Elpers, BSN, RN

## 2023-07-16 ENCOUNTER — Other Ambulatory Visit: Payer: Self-pay

## 2023-07-16 ENCOUNTER — Ambulatory Visit (INDEPENDENT_AMBULATORY_CARE_PROVIDER_SITE_OTHER): Payer: Self-pay | Admitting: Infectious Diseases

## 2023-07-16 ENCOUNTER — Encounter: Payer: Self-pay | Admitting: Infectious Diseases

## 2023-07-16 VITALS — BP 163/99 | HR 91 | Temp 98.5°F | Ht 69.0 in | Wt 146.0 lb

## 2023-07-16 DIAGNOSIS — J302 Other seasonal allergic rhinitis: Secondary | ICD-10-CM

## 2023-07-16 DIAGNOSIS — J438 Other emphysema: Secondary | ICD-10-CM

## 2023-07-16 DIAGNOSIS — R030 Elevated blood-pressure reading, without diagnosis of hypertension: Secondary | ICD-10-CM

## 2023-07-16 DIAGNOSIS — R053 Chronic cough: Secondary | ICD-10-CM

## 2023-07-16 DIAGNOSIS — J309 Allergic rhinitis, unspecified: Secondary | ICD-10-CM

## 2023-07-16 DIAGNOSIS — B20 Human immunodeficiency virus [HIV] disease: Secondary | ICD-10-CM

## 2023-07-16 MED ORDER — BIKTARVY 50-200-25 MG PO TABS: 1.0000 | ORAL_TABLET | Freq: Every day | ORAL | 11 refills | Status: DC

## 2023-07-16 MED ORDER — PROMETHAZINE-DM 6.25-15 MG/5ML PO SYRP: 5.0000 mL | ORAL_SOLUTION | Freq: Four times a day (QID) | ORAL | 0 refills | Status: AC | PRN

## 2023-07-16 MED ORDER — BIKTARVY 50-200-25 MG PO TABS: 1.0000 | ORAL_TABLET | Freq: Every day | ORAL | 11 refills | Status: AC

## 2023-07-16 MED ORDER — ROSUVASTATIN CALCIUM 10 MG PO TABS: 10.0000 mg | ORAL_TABLET | Freq: Every day | ORAL | 11 refills | Status: AC

## 2023-07-16 NOTE — Progress Notes (Signed)
 Subjective:    Patient ID: Courtney Kim is a 58 y.o. female     DOB: 01/21/1966   MRN: [id number]    Chief Complaint  Patient presents with   Follow-up    Subjective    Discussed the use of AI scribe software for clinical note transcription with the patient, who gave verbal consent to proceed.  History of Present Illness   Courtney Kim is a 58 year old female here for routine HIV care and follow up.   She has a chronic cough that has persisted for a long time and worsens at night. She was previously working with Dr. Washington Hacker at [facility] - notably has a history of emphysema (no further cigarette use, does vape) and experiences shortness of breath when climbing stairs but can walk on flat ground without issues. Inhalers have aggravated her symptoms in the past, and she prefers oral medications. Bactrim  has previously helped alleviate her cough - she recalls she had improvement in her cough and energy during that time she was on it. She is sensitive to OTC symptom medications, noting that some make her feel lethargic or unwell. She was in the Urgent Care for cough previously and could not afford the medications she was prescribed. Some nasal congestion noted. She notes that her cough is worse at night sometimes keeping her up. During this period, she has not experienced fever, chills, or night sweats. She has not had any productive cough, mostly dry.   She has been off her HIV medication, Biktarvy , for a week due to insurance loss - she was given a few weeks of samples while we arranged [facility] patient assistance.         09/25/2021    8:55 AM 03/28/2021   10:51 AM 07/22/2020    9:44 AM  Depression screen PHQ 2/9  Decreased Interest 0 0 0  Down, Depressed, Hopeless 0 0 1  PHQ - 2 Score 0 0 1       No data to display             Review of Systems  Constitutional:  Negative for chills and fever.  HENT:  Negative for tinnitus.   Eyes:  Negative for blurred vision  and photophobia.  Respiratory:  Negative for cough and sputum production.   Cardiovascular:  Negative for chest pain.  Gastrointestinal:  Negative for diarrhea, nausea and vomiting.  Genitourinary:  Negative for dysuria.  Skin:  Negative for rash.  Neurological:  Negative for headaches.     Outpatient Medications Prior to Visit  Medication Sig Dispense Refill   albuterol  (VENTOLIN  HFA) 108 (90 Base) MCG/ACT inhaler TAKE 2 PUFFS BY MOUTH EVERY 6 HOURS AS NEEDED FOR WHEEZE OR SHORTNESS OF BREATH 25.5 each 1   aspirin EC 81 MG tablet Take 81 mg by mouth daily. Swallow whole.     benzonatate  (TESSALON ) 100 MG capsule Take 1 capsule (100 mg total) by mouth every 8 (eight) hours. 21 capsule 0   bictegravir-emtricitabine-tenofovir AF (BIKTARVY ) 50-200-25 MG TABS tablet Take 1 tablet by mouth daily. 30 tablet 0   budesonide -formoterol  (SYMBICORT ) 160-4.5 MCG/ACT inhaler Inhale 2 puffs into the lungs in the morning and at bedtime. 1 each 5   doxycycline  (VIBRAMYCIN ) 100 MG capsule Take 1 capsule (100 mg total) by mouth 2 (two) times daily. 20 capsule 0   ferrous sulfate  325 (65 FE) MG tablet Take 1 tablet (325 mg total) by mouth daily with breakfast. Please take with a source  of Vitamin C 90 tablet 3   HYDROcodone  bit-homatropine (HYCODAN) 5-1.5 MG/5ML syrup Take 5 mLs by mouth every 6 (six) hours as needed for cough. 120 mL 0   Spacer/Aero-Hold Chamber Bags MISC 1 each by Does not apply route daily as needed. 1 each 0   Tiotropium Bromide Monohydrate  (SPIRIVA  RESPIMAT) 2.5 MCG/ACT AERS Inhale 2 puffs into the lungs daily. 4 g 0   No facility-administered medications prior to visit.    Past Medical History:  Diagnosis Date   HIV infection (HCC)    Hypertension     Social History   Socioeconomic History   Marital status: Single    Spouse name: Not on file   Number of children: Not on file   Years of education: Not on file   Highest education level: Not on file  Occupational History    Not on file  Tobacco Use   Smoking status: Some Days    Current packs/day: 2.00    Average packs/day: 2.0 packs/day for 20.0 years (40.0 ttl pk-yrs)    Types: E-cigarettes, Cigarettes   Smokeless tobacco: Current   Tobacco comments:    States she vapes daily  Vaping Use   Vaping status: Some Days   Substances: Nicotine  Substance and Sexual Activity   Alcohol use: Never   Drug use: Never   Sexual activity: Not Currently    Partners: Male    Comment: declined  condoms  Other Topics Concern   Not on file  Social History Narrative   Leave alone   Smoke cigarette occasion   Social Drivers of Corporate investment banker Strain: Not on file  Food Insecurity: Not on file  Transportation Needs: Not on file  Physical Activity: Not on file  Stress: Not on file  Social Connections: Not on file  Intimate Partner Violence: Not on file    Objective     Objective:    Today's Vitals   07/16/23 1016  BP: (!) 163/99  Pulse: 91  Temp: 98.5 F (36.9 C)  TempSrc: Oral  SpO2: 98%  Weight: 146 lb (66.2 kg)  Height: 5\' 9"  (1.753 m)  PainSc: 0-No pain   Body mass index is 21.56 kg/m.   Physical Exam Vitals reviewed.  Constitutional:      Appearance: Normal appearance. She is not ill-appearing.  HENT:     Mouth/Throat:     Mouth: Mucous membranes are moist.     Pharynx: Oropharynx is clear.  Cardiovascular:     Rate and Rhythm: Normal rate and regular rhythm.  Pulmonary:     Effort: No respiratory distress.     Breath sounds: Normal breath sounds. No wheezing, rhonchi or rales.  Musculoskeletal:     Cervical back: Normal range of motion.  Lymphadenopathy:     Cervical: No cervical adenopathy.  Neurological:     Mental Status: She is alert.     LABS: Lab Results  Component Value Date   HIV1RNAQUANT <20 (H) 09/25/2021   HIV1RNAQUANT 60 (H) 03/13/2021   HIV1RNAQUANT Not Detected 07/22/2020    Lab Results  Component Value Date   CREATININE 0.69 07/28/2022    CREATININE 0.66 03/25/2022   CREATININE 0.82 09/25/2021    Lab Results  Component Value Date   ALT 10 09/25/2021   AST 14 09/25/2021   ALKPHOS 89 10/23/2019   BILITOT 0.5 09/25/2021    Lab Results  Component Value Date   WBC 2.4 (L) 07/28/2022   HGB 13.2 07/28/2022  HCT 39.4 07/28/2022   MCV 87.6 07/28/2022   PLT 264 07/28/2022    No results found for: "RPR"     Assessment & Plan:  Assessment and Plan    HIV infection - VL Previously < 20 on Biktarvy , CD4 > 300 -  HIV infection managed with Biktarvy  once daily - has been off for a week due to transition to . She has been off medication for a week due to insurance issues, increasing the risk of viral rebound. Discussed the importance of resuming medication to prevent viral rebound and maintain health. Explained that some individuals are more sensitive to missed doses, and viral load will be checked to assess impact. - Resume Biktarvy  as soon as possible. - Order viral load test today. - Repeat viral load in 1-2 months after resuming medication if elevated for reassurance.  - Request [facility] to use a regular bottle for medication to maintain discretion. - Requests yearly visits - OK by me to help with her preference.   Cough - H/O Emphysema -  Chronic cough, worse at night. Likely multifactorial with contributions from allergies and possibly emphysema. Inhalers have been ineffective. Discussed potential role of allergies contributing. Bactrim  previously used but not indicated currently as no signs of infection. Discussed non-pharmacological options like honey and lemon for symptomatic relief. Claritin and Flonase daily to help with nasal drip and allergic components with high pollen burden. When able would like to get her back in to see Dr. Washington Hacker to discuss further other options given she did not have a good response with inhalers.  - Try daily allergy medication such as Claritin. - Consider honey and lemon for  symptomatic relief of cough. - Provide prescription for promethazine cough syrup if needed to help with minimizing night time interruptions.  Allergic rhinitis Allergic rhinitis suspected as a contributing factor to chronic cough. Discussed commonality of allergies causing respiratory symptoms. She is sensitive to medications and prefers oral medications over inhalers. - Try daily Claritin for allergy management. - Provide samples of non-sedating antihistamines if available.  Primary Prevention, Heart Disease -  Risk of heart disease estimated at 9% over the next ten years. Discussed benefits of starting a statin to reduce risk of heart attack and stroke by 35%. She is interested in starting treatment. Explained that Crestor is covered under the patient assistance program and potential side effects include muscle aches. - Start Crestor (rosuvastatin) for cholesterol management. - Monitor for muscle aches, especially in upper arms, and adjust dosing if necessary.     Elevated Blood Pressure -  She is very nervous about coming to office visits here. I would like for her to consider BP monitoring at home to get a better understanding about normal values for her but she cannot afford at this moment.   Financial Stressors -  Lost her job about a month ago. We got her settled with patient assistance but she is reluctant to accept anything that would require a case Production designer, theatre/television/film. THP information discussed and provided for her to research on her own.   Meds ordered this encounter  Medications   DISCONTD: bictegravir-emtricitabine-tenofovir AF (BIKTARVY ) 50-200-25 MG TABS tablet    Sig: Take 1 tablet by mouth daily.    Dispense:  30 tablet    Refill:  11    Prescription Type::   Renewal   rosuvastatin (CRESTOR) 10 MG tablet    Sig: Take 1 tablet (10 mg total) by mouth daily.    Dispense:  30 tablet  Refill:  11   bictegravir-emtricitabine-tenofovir AF (BIKTARVY ) 50-200-25 MG TABS tablet    Sig:  Take 1 tablet by mouth daily.    Dispense:  30 tablet    Refill:  11    Please put in a regular prescription bottle    Prescription Type::   Renewal   promethazine -dextromethorphan (PROMETHAZINE -DM) 6.25-15 MG/5ML syrup    Sig: Take 5 mLs by mouth 4 (four) times daily as needed for cough.    Dispense:  473 mL    Refill:  0   Orders Placed This Encounter  Procedures   HIV 1 RNA quant-no reflex-bld   T-helper cells (CD4) count   COMPLETE METABOLIC PANEL WITHOUT GFR   CBC   Lipid panel   RTC in 1 year with labs repeated in 2 months - offer Prevnar 20 at that visit as well.    Gibson Kurtz, MSN, NP-C [facility] for Infectious Disease [facility]  [email or web address] Pager: [phone] Office: [phone] [facility] Main Line: [phone]

## 2023-07-16 NOTE — Patient Instructions (Addendum)
 [facility] -   [place] [address] [place] [place], [place] [address] [phone]  Fax: [phone]  [place] [address] [place], [place] [address] [phone]  Fax: [phone]  Traditional Medicines - may have some soothing

## 2023-07-18 ENCOUNTER — Ambulatory Visit: Payer: Self-pay | Admitting: Infectious Diseases

## 2023-07-18 LAB — COMPLETE METABOLIC PANEL WITHOUT GFR
AG Ratio: 1.3 (calc) (ref 1.0–2.5)
ALT: 7 U/L (ref 6–29)
AST: 13 U/L (ref 10–35)
Albumin: 4.7 g/dL (ref 3.6–5.1)
Alkaline phosphatase (APISO): 75 U/L (ref 37–153)
BUN: 10 mg/dL (ref 7–25)
CO2: 23 mmol/L (ref 20–32)
Calcium: 9.7 mg/dL (ref 8.6–10.4)
Chloride: 105 mmol/L (ref 98–110)
Creat: 0.79 mg/dL (ref 0.50–1.03)
Globulin: 3.6 g/dL (ref 1.9–3.7)
Glucose, Bld: 90 mg/dL (ref 65–99)
Potassium: 4.1 mmol/L (ref 3.5–5.3)
Sodium: 139 mmol/L (ref 135–146)
Total Bilirubin: 0.8 mg/dL (ref 0.2–1.2)
Total Protein: 8.3 g/dL — ABNORMAL HIGH (ref 6.1–8.1)

## 2023-07-18 LAB — HIV-1 RNA QUANT-NO REFLEX-BLD
HIV 1 RNA Quant: 72 {copies}/mL — ABNORMAL HIGH
HIV-1 RNA Quant, Log: 1.86 {Log_copies}/mL — ABNORMAL HIGH

## 2023-07-18 LAB — LIPID PANEL
Cholesterol: 148 mg/dL (ref <200)
HDL: 61 mg/dL (ref >=50)
LDL Cholesterol (Calc): 68 mg/dL
Non-HDL Cholesterol (Calc): 87 mg/dL (ref <130)
Total CHOL/HDL Ratio: 2.4 (calc) (ref <5.0)
Triglycerides: 100 mg/dL (ref <150)

## 2023-07-18 LAB — T-HELPER CELLS (CD4) COUNT (NOT AT ARMC)
Absolute CD4: 630 {cells}/uL (ref 490–1740)
CD4 T Helper %: 44 % (ref 30–61)
Total lymphocyte count: 1439 {cells}/uL (ref 850–3900)

## 2023-07-18 LAB — CBC
HCT: 38.9 % (ref 35.0–45.0)
Hemoglobin: 12.8 g/dL (ref 11.7–15.5)
MCH: 29.6 pg (ref 27.0–33.0)
MCHC: 32.9 g/dL (ref 32.0–36.0)
MCV: 90 fL (ref 80.0–100.0)
MPV: 11.4 fL (ref 7.5–12.5)
Platelets: 246 10*3/uL (ref 140–400)
RBC: 4.32 10*6/uL (ref 3.80–5.10)
RDW: 12.8 % (ref 11.0–15.0)
WBC: 4.5 10*3/uL (ref 3.8–10.8)

## 2023-07-18 NOTE — Progress Notes (Signed)
 Please call Jannat to let her know that her labs look great - I am so happy for her.  Her viral load even being off the Biktarvy  for a week is still in the range of undetectable at 72 copies.  Her CD4 count (immune system) looks amazing - it has recovered so nicely the last few years and is back in the normal range at 630 cells. Her other blood counts have also returned to normal also.   I see she left without scheduling a follow up - with us  starting the cholesterol medication I would like for her to come back in 1 month for a lab only check to make sure her liver is tolerating the new medication OK.   Would also like to get her scheduled for a lab visit and annual check in in 1 year (her preference). She has a lot of stress about coming to the office for appointments <3   Thank you kindly,  Trevor Fudge

## 2023-07-18 NOTE — Telephone Encounter (Signed)
-----   Message from [place] sent at 07/18/2023 10:37 AM EDT ----- Please call Courtney Kim to let her know that her labs look great - I am so happy for her.  Her viral load even being off the Biktarvy  for a week is still in the range of undetectable at 72 copies.  Her CD4 count (immune system) looks amazing - it has recovered so nicely the last few years and is back in the normal range at 630 cells. Her other blood counts have also returned to normal also.   I see she left without scheduling a follow up - with us  starting the cholesterol medication I would like for her to come back in 1 month for a lab only check to make sure her liver is tolerating the new medication OK.   Would also like to get her scheduled for a lab visit and annual check in in 1 year (her preference). She has a lot of stress about coming to the office for appointments <3   Thank you kindly,  Trevor Fudge

## 2023-07-18 NOTE — Telephone Encounter (Signed)
 Spoke with patient regarding results. No questions at this time. Would like to hold off on scheduling appt until Monday due to starting new job and not knowing her schedule/ avoid missing days.  Julien Odor, RMA

## 2023-11-16 IMAGING — CT CT CHEST HIGH RESOLUTION
3 of 6 series · 17 of 34 positions shown, 19 images · non-contrast
Comparison: None.

CLINICAL DATA: Cough for 6 months, increased with vaping, former
smoker

EXAM:
CT CHEST WITHOUT CONTRAST
TECHNIQUE: Multidetector CT imaging of the chest was performed following the
standard protocol without intravenous contrast. High resolution
imaging of the lungs, as well as inspiratory and expiratory imaging,
was performed.

[Series 2: chest · axial · 0.66mm/px · z∈[-282,-72]mm · 6 of 159 slices shown, 8 images]
[im 27/159  mediastinal]
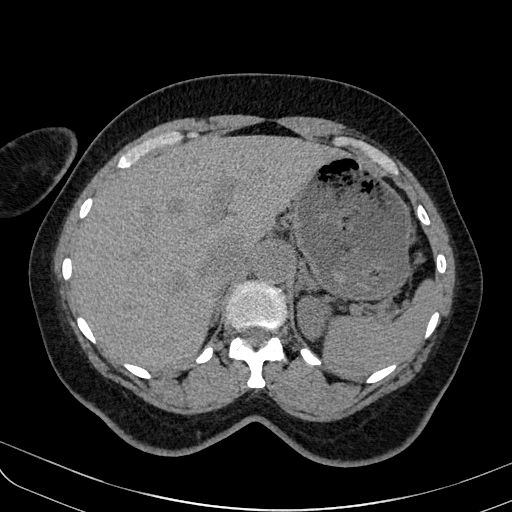
[im 27/159  lung]
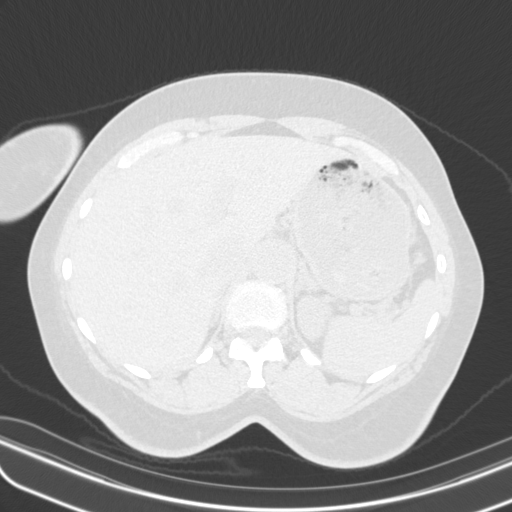
[im 53/159  lung]
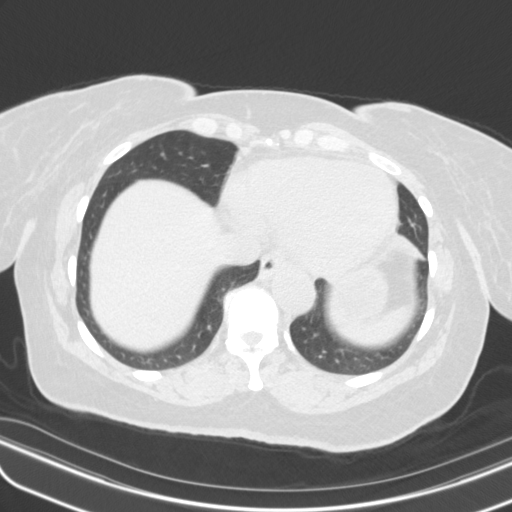
[im 75/159  lung]
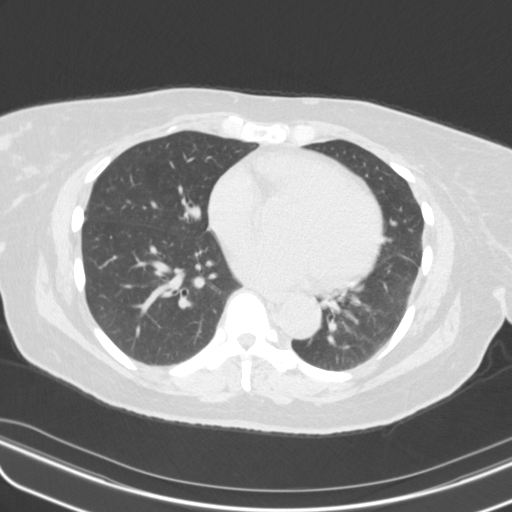
[im 80/159  lung]
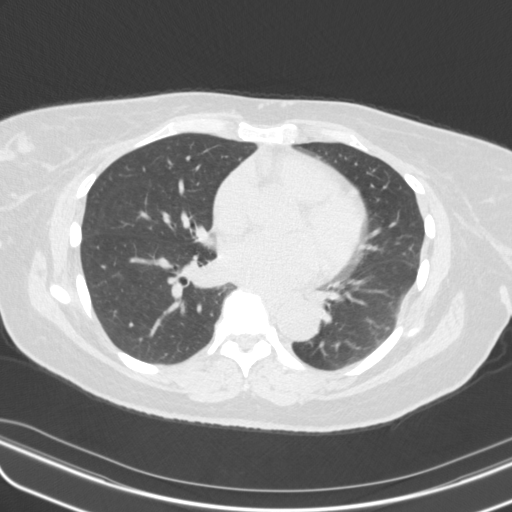
[im 106/159  mediastinal]
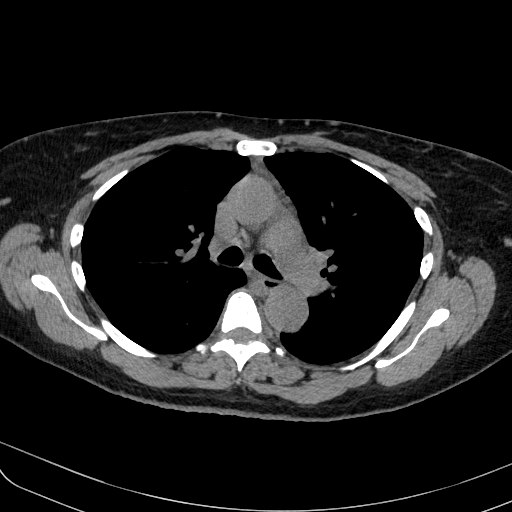
[im 106/159  lung]
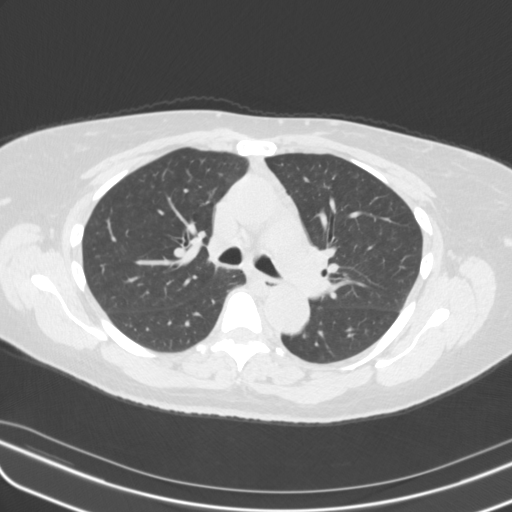
[im 132/159  lung]
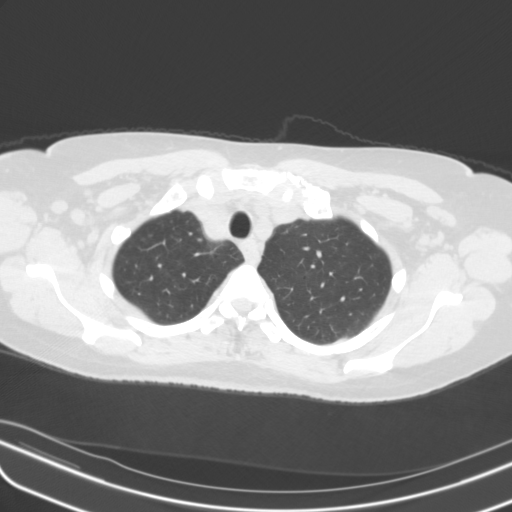

[Series 7: lungs · axial · 0.66mm/px · z∈[-226,-174]mm · 3 of 130 slices shown]
[im 26/130  lung]
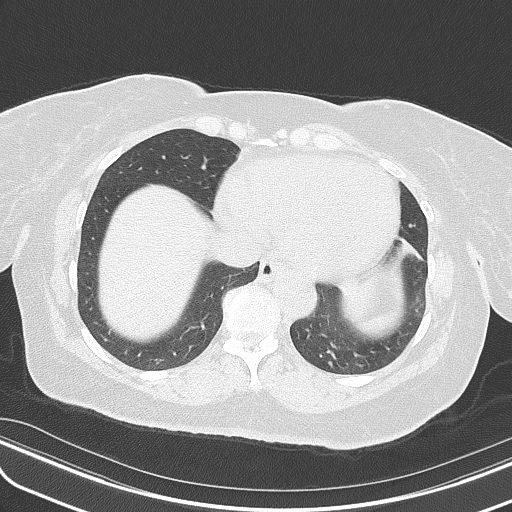
[im 46/130  lung]
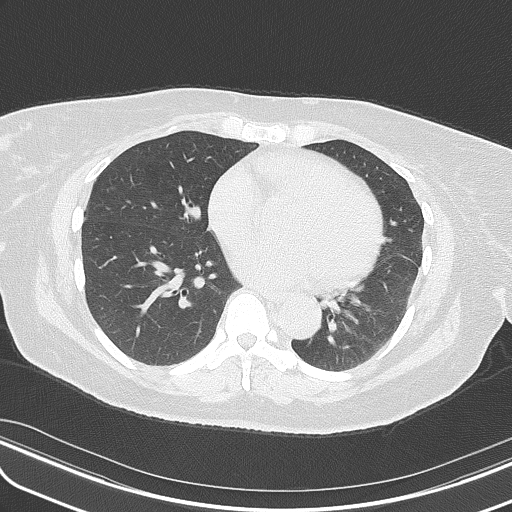
[im 52/130  lung]
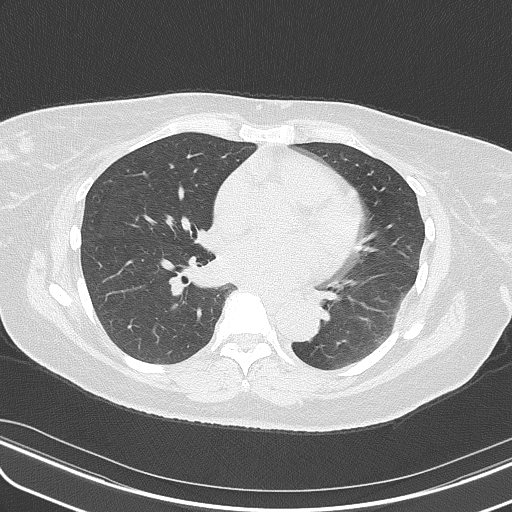

[Series 10: hi res 1mm · axial · 0.66mm/px · z∈[-254,-42]mm · 8 of 260 slices shown]
[im 24/260  lung]
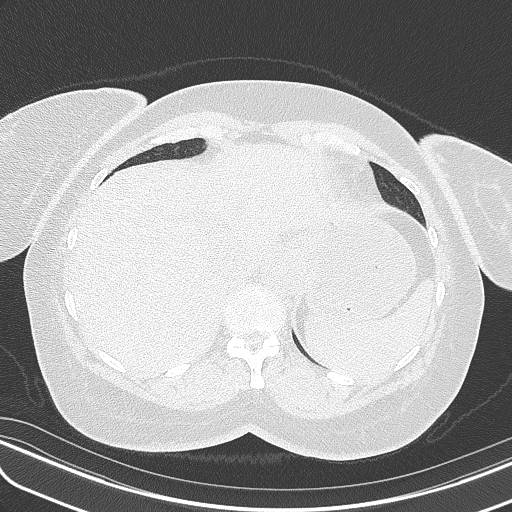
[im 71/260  lung]
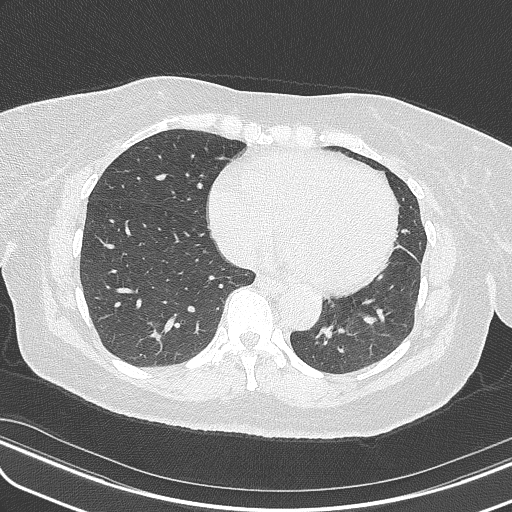
[im 92/260  lung]
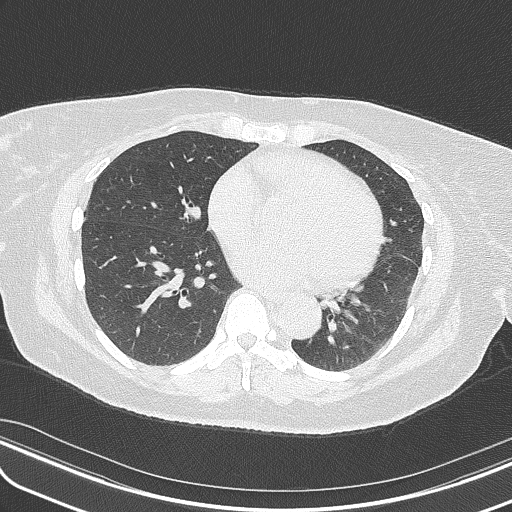
[im 95/260  lung]
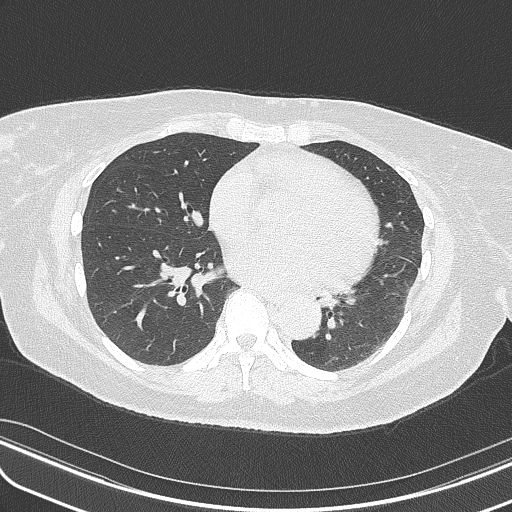
[im 142/260  lung]
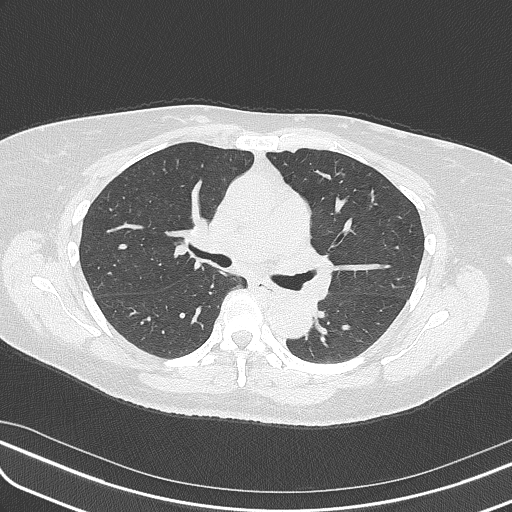
[im 165/260  lung]
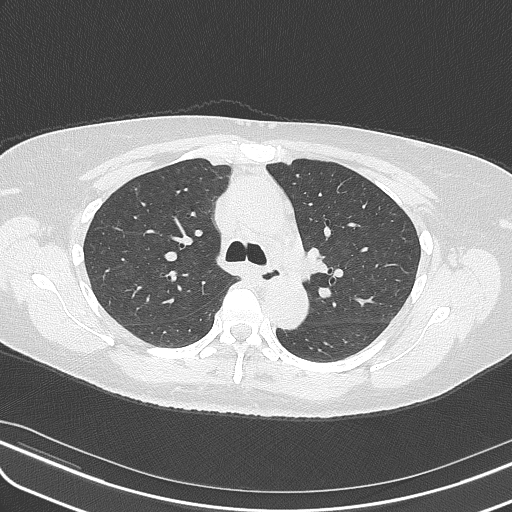
[im 189/260  lung]
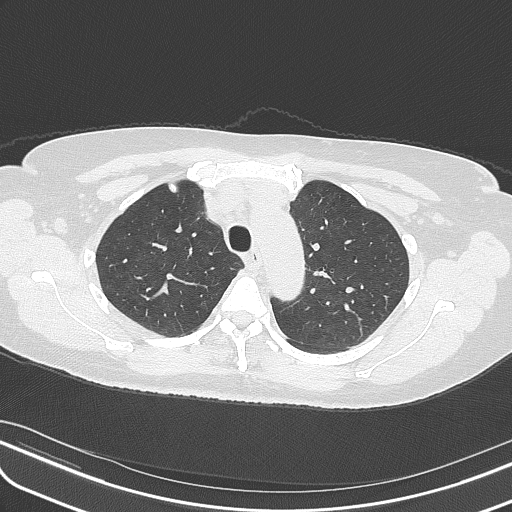
[im 236/260  lung]
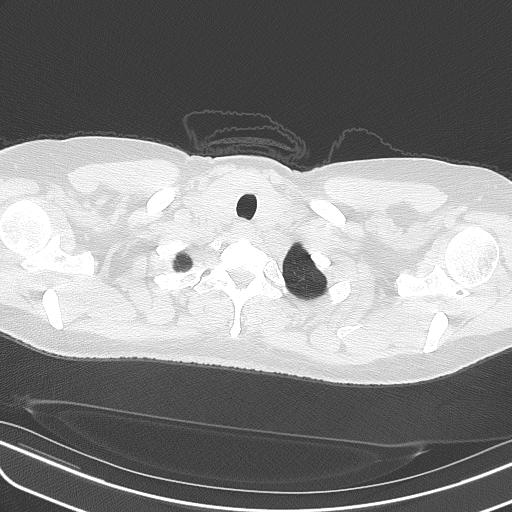

[17 of 34 positions shown; findings below may reference images not displayed]

FINDINGS: Cardiovascular: Scattered aortic atherosclerosis. Normal heart size.
No pericardial effusion.

Mediastinum/Nodes: Prominent bilateral axillary lymph nodes
measuring up to 2.2 x 0.8 cm in the left axilla (series 2, image
29). No enlarged mediastinal or hilar lymph nodes. Thymic remnant in
the anterior mediastinum. Thyroid gland, trachea, and esophagus
demonstrate no significant findings.

Lungs/Pleura: Minimal centrilobular emphysema. No evidence of
fibrotic interstitial lung disease. No significant air trapping on
expiratory phase imaging series. No pleural effusion or
pneumothorax.

Upper Abdomen: No acute abnormality.

Musculoskeletal: No chest wall mass or suspicious bone lesions
identified.
IMPRESSION: 1. No evidence of fibrotic interstitial lung disease. No significant
air trapping on expiratory phase imaging series.
2. Minimal emphysema.
3. Prominent bilateral axillary lymph nodes, nonspecific and likely
reactive. No mediastinal lymphadenopathy.

Aortic Atherosclerosis (WQ9YO-CXV.V) and Emphysema (WQ9YO-R0T.H).
